# Patient Record
Sex: Male | Born: 1985 | Race: White | Hispanic: No | Marital: Married
Health system: Midwestern US, Community
[De-identification: ages and names within clinical notes are randomized; demographics above are authoritative.]

## PROBLEM LIST (undated history)

## (undated) DIAGNOSIS — A4902 Methicillin resistant Staphylococcus aureus infection, unspecified site: Secondary | ICD-10-CM

## (undated) DIAGNOSIS — K4030 Unilateral inguinal hernia, with obstruction, without gangrene, not specified as recurrent: Secondary | ICD-10-CM

## (undated) DIAGNOSIS — K4090 Unilateral inguinal hernia, without obstruction or gangrene, not specified as recurrent: Secondary | ICD-10-CM

## (undated) HISTORY — PX: KNEE SURGERY: SHX244

## (undated) HISTORY — PX: ORTHOPEDIC SURGERY: SHX850

---

## 2011-01-25 ENCOUNTER — Inpatient Hospital Stay (HOSPITAL_COMMUNITY)
Admission: EM | Admit: 2011-01-25 | Discharge: 2011-01-30 | DRG: 477 | Disposition: A | Discharge status: Home / Self Care | Payer: BC Managed Care – PPO | Attending: Orthopedic Surgery | Admitting: Orthopedic Surgery

## 2011-01-25 DIAGNOSIS — Z8614 Personal history of Methicillin resistant Staphylococcus aureus infection: Secondary | ICD-10-CM

## 2011-01-25 DIAGNOSIS — A4902 Methicillin resistant Staphylococcus aureus infection, unspecified site: Secondary | ICD-10-CM | POA: Diagnosis present

## 2011-01-25 DIAGNOSIS — Z22322 Carrier or suspected carrier of Methicillin resistant Staphylococcus aureus: Secondary | ICD-10-CM

## 2011-01-25 DIAGNOSIS — L02419 Cutaneous abscess of limb, unspecified: Principal | ICD-10-CM | POA: Diagnosis present

## 2011-01-26 DIAGNOSIS — L02419 Cutaneous abscess of limb, unspecified: Secondary | ICD-10-CM

## 2011-01-26 DIAGNOSIS — A4902 Methicillin resistant Staphylococcus aureus infection, unspecified site: Secondary | ICD-10-CM

## 2011-01-26 DIAGNOSIS — L03119 Cellulitis of unspecified part of limb: Secondary | ICD-10-CM

## 2011-01-27 ENCOUNTER — Inpatient Hospital Stay (HOSPITAL_COMMUNITY): Payer: BC Managed Care – PPO

## 2011-01-27 MED ORDER — IOHEXOL 300 MG/ML  SOLN
100.0000 mL | Freq: Once | INTRAMUSCULAR | Status: AC | PRN
  Administered 2011-01-27: 100 mL via INTRAVENOUS

## 2011-01-28 LAB — BASIC METABOLIC PANEL
CO2: 30 mEq/L (ref 19–32)
Glucose, Bld: 92 mg/dL (ref 70–99)
Potassium: 4.4 mEq/L (ref 3.5–5.1)
Sodium: 142 mEq/L (ref 135–145)

## 2011-01-28 MED ORDER — IOHEXOL 300 MG/ML  SOLN
100.0000 mL | Freq: Once | INTRAMUSCULAR | Status: AC | PRN
  Administered 2011-01-28: 100 mL via INTRAVENOUS

## 2011-01-30 LAB — ANAEROBIC CULTURE
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
## 2011-07-01 ENCOUNTER — Emergency Department (HOSPITAL_BASED_OUTPATIENT_CLINIC_OR_DEPARTMENT_OTHER)
Admission: EM | Admit: 2011-07-01 | Discharge: 2011-07-01 | Disposition: A | Discharge status: Home / Self Care | Payer: BC Managed Care – PPO | Attending: Emergency Medicine | Admitting: Emergency Medicine

## 2011-07-01 ENCOUNTER — Encounter: Payer: Self-pay | Admitting: Emergency Medicine

## 2011-07-01 DIAGNOSIS — L0390 Cellulitis, unspecified: Secondary | ICD-10-CM

## 2011-07-01 DIAGNOSIS — F17200 Nicotine dependence, unspecified, uncomplicated: Secondary | ICD-10-CM | POA: Insufficient documentation

## 2011-07-01 DIAGNOSIS — L02419 Cutaneous abscess of limb, unspecified: Secondary | ICD-10-CM | POA: Insufficient documentation

## 2011-07-01 DIAGNOSIS — Z8614 Personal history of Methicillin resistant Staphylococcus aureus infection: Secondary | ICD-10-CM | POA: Insufficient documentation

## 2011-07-01 DIAGNOSIS — L03119 Cellulitis of unspecified part of limb: Secondary | ICD-10-CM | POA: Insufficient documentation

## 2011-07-01 HISTORY — DX: Methicillin resistant Staphylococcus aureus infection, unspecified site: A49.02

## 2011-07-01 MED ORDER — CLINDAMYCIN HCL 150 MG PO CAPS: 300.0000 mg | ORAL_CAPSULE | Freq: Three times a day (TID) | ORAL | Status: AC

## 2011-07-01 MED ORDER — HYDROCODONE-ACETAMINOPHEN 5-325 MG PO TABS: 1.0000 | ORAL_TABLET | ORAL | Status: AC | PRN

## 2011-07-01 NOTE — ED Provider Notes (Signed)
History     Chief Complaint  Patient presents with  . Skin Ulcer   HPI Pt w/ h/o MRSA presents w/ painful abscess w/ surrounding erythema of right anterior thigh x 2 days.  No drainage. No associated fever.  Has not taken anything for pain or applied warm compresses.   Past Medical History  Diagnosis Date  . MRSA (methicillin resistant Staphylococcus aureus)     Past Surgical History  Procedure Date  . Knee surgery   . Orthopedic surgery     No family history on file.  History  Substance Use Topics  . Smoking status: Current Some Day Smoker  . Smokeless tobacco: Not on file  . Alcohol Use: Yes     occ      Review of Systems  All other systems reviewed and are negative.    Physical Exam  BP 139/64  Pulse 83  Temp(Src) 98 F (36.7 C) (Oral)  Resp 16  Ht 6' (1.829 m)  Wt 160 lb (72.576 kg)  BMI 21.70 kg/m2  SpO2 99%  Physical Exam  Nursing note and vitals reviewed. Constitutional: He is oriented to person, place, and time. He appears well-developed and well-nourished. No distress.  HENT:  Head: Normocephalic and atraumatic.  Eyes:       Normal appearance  Neck: Normal range of motion.  Neurological: He is alert and oriented to person, place, and time.  Skin:       1cm of induration at distal right anterior thigh w/ 9x12cm surrounding erythema.  Warm to touch and ttp.    Psychiatric: He has a normal mood and affect. His behavior is normal.    ED Course  Procedures  MDM Pt presents w/ 1cm abscess w/ surrounding cellulitis of right distal anterior thigh.  Did not drain abscess d/t size. Margins drawn w/ skin marker and discharged pt home w/ clinda (sulfa allergy) and pain medication.  Strict return precautions discussed.       Arie Sabina [place], [place] 07/01/11 1737

## 2011-07-01 NOTE — ED Notes (Signed)
Pt c/o sore to RT knee- hx of MRSA

## 2011-07-01 NOTE — ED Notes (Signed)
No PCP 

## 2011-08-11 NOTE — ED Provider Notes (Signed)
History     CSN: [id number] Arrival date & time: 07/01/2011  4:44 PM  Chief Complaint  Patient presents with  . Skin Ulcer   HPI  Past Medical History  Diagnosis Date  . MRSA (methicillin resistant Staphylococcus aureus)     Past Surgical History  Procedure Date  . Knee surgery   . Orthopedic surgery     No family history on file.  History  Substance Use Topics  . Smoking status: Current Some Day Smoker  . Smokeless tobacco: Not on file  . Alcohol Use: Yes     occ      Review of Systems  Physical Exam  BP 139/64  Pulse 83  Temp(Src) 98 F (36.7 C) (Oral)  Resp 16  Ht 6' (1.829 m)  Wt 160 lb (72.576 kg)  BMI 21.70 kg/m2  SpO2 99%  Physical Exam  ED Course  Procedures  MDM Medical screening examination/treatment/procedure(s) were performed by non-physician practitioner and as supervising physician I was immediately available for consultation/collaboration.       Nat Christen, MD 08/11/11 [phone]

## 2011-10-26 IMAGING — CR DG TIBIA/FIBULA 2V*R*
4 series · 4 of 4 positions shown · non-contrast
Comparison: None

CLINICAL DATA: Right knee infection.  Question osteomyelitis.

RIGHT TIBIA AND FIBULA - 2 VIEW

[t tib/fib ap right (1 of 2)]
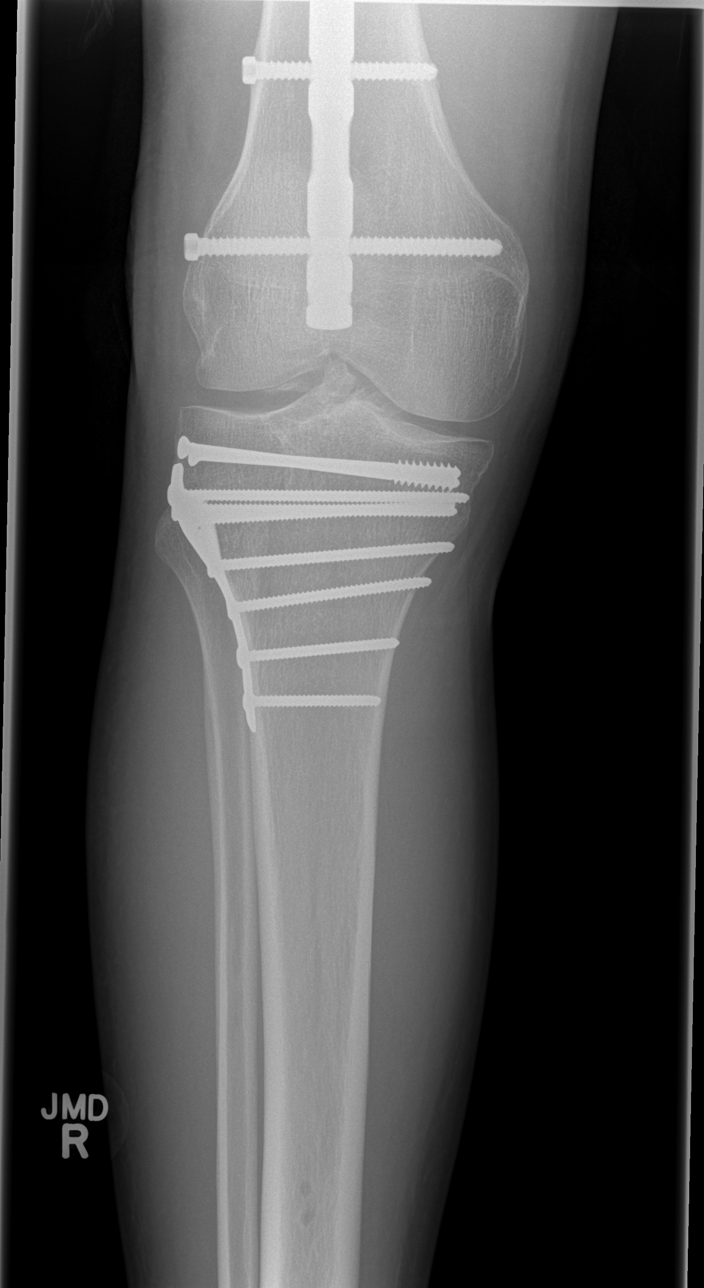

[t tib/fib lat right (1 of 2)]
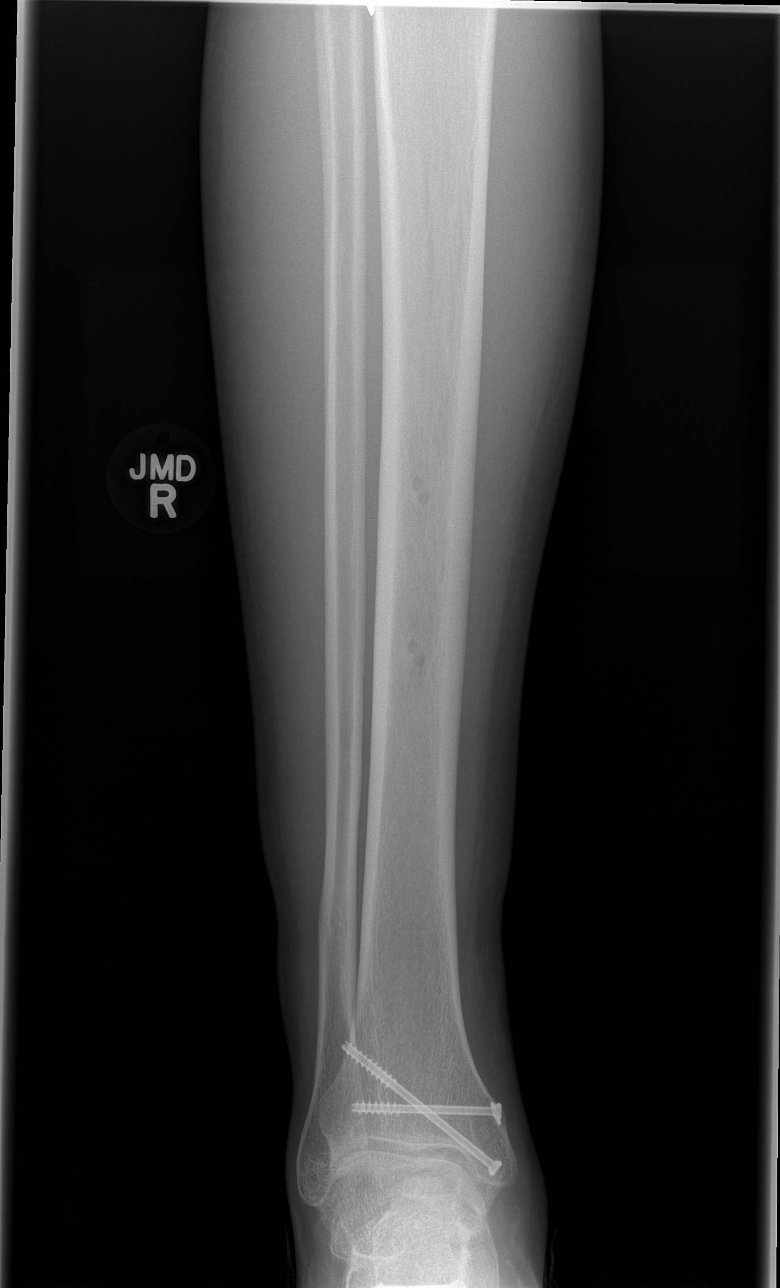

[t tib/fib ap right (2 of 2)]
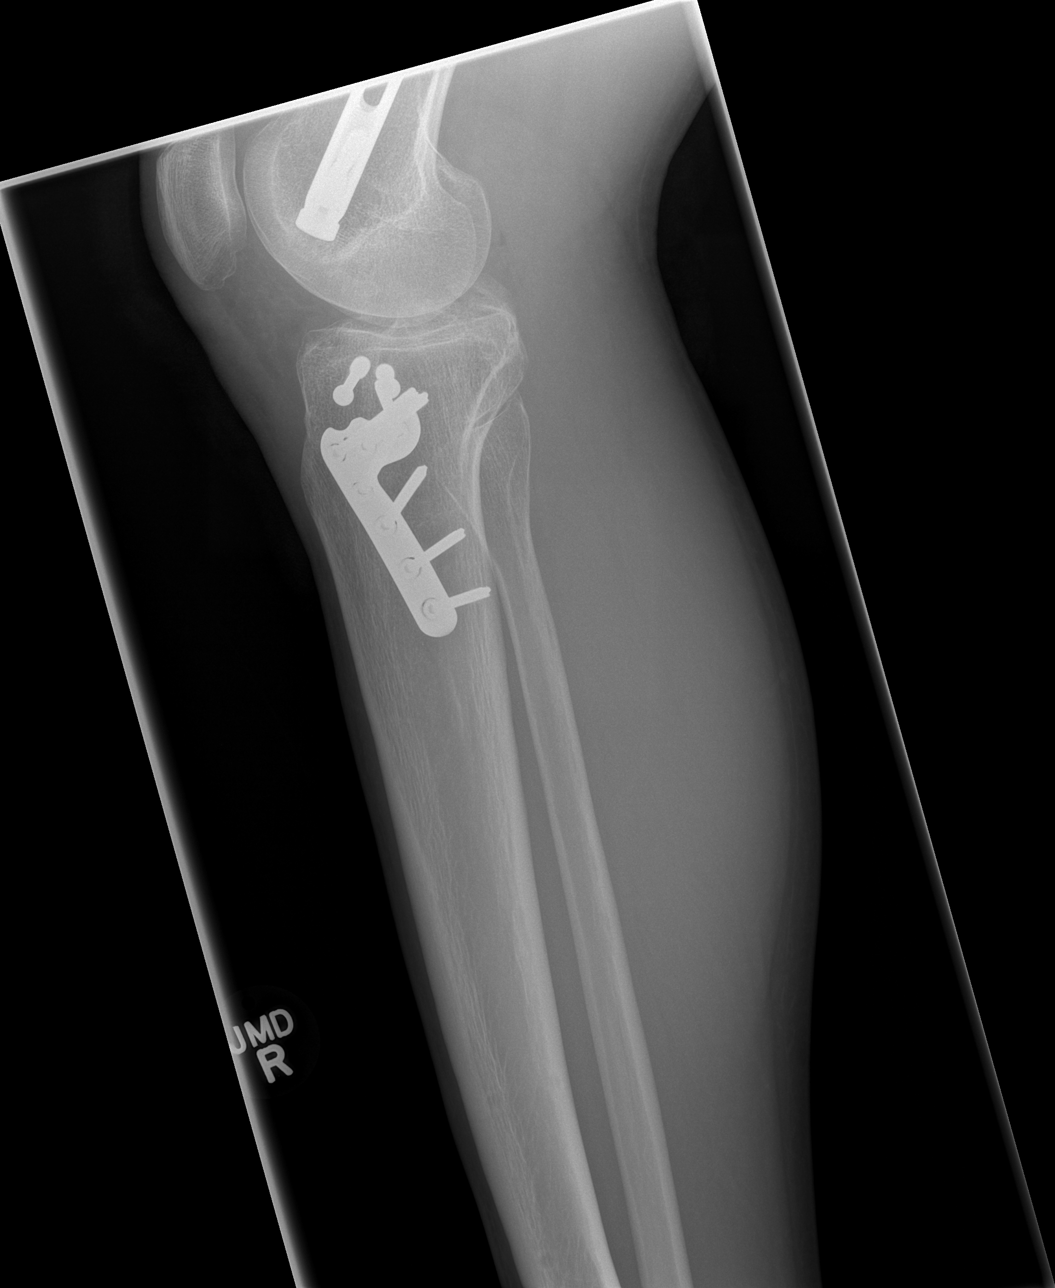

[t tib/fib lat right (2 of 2)]
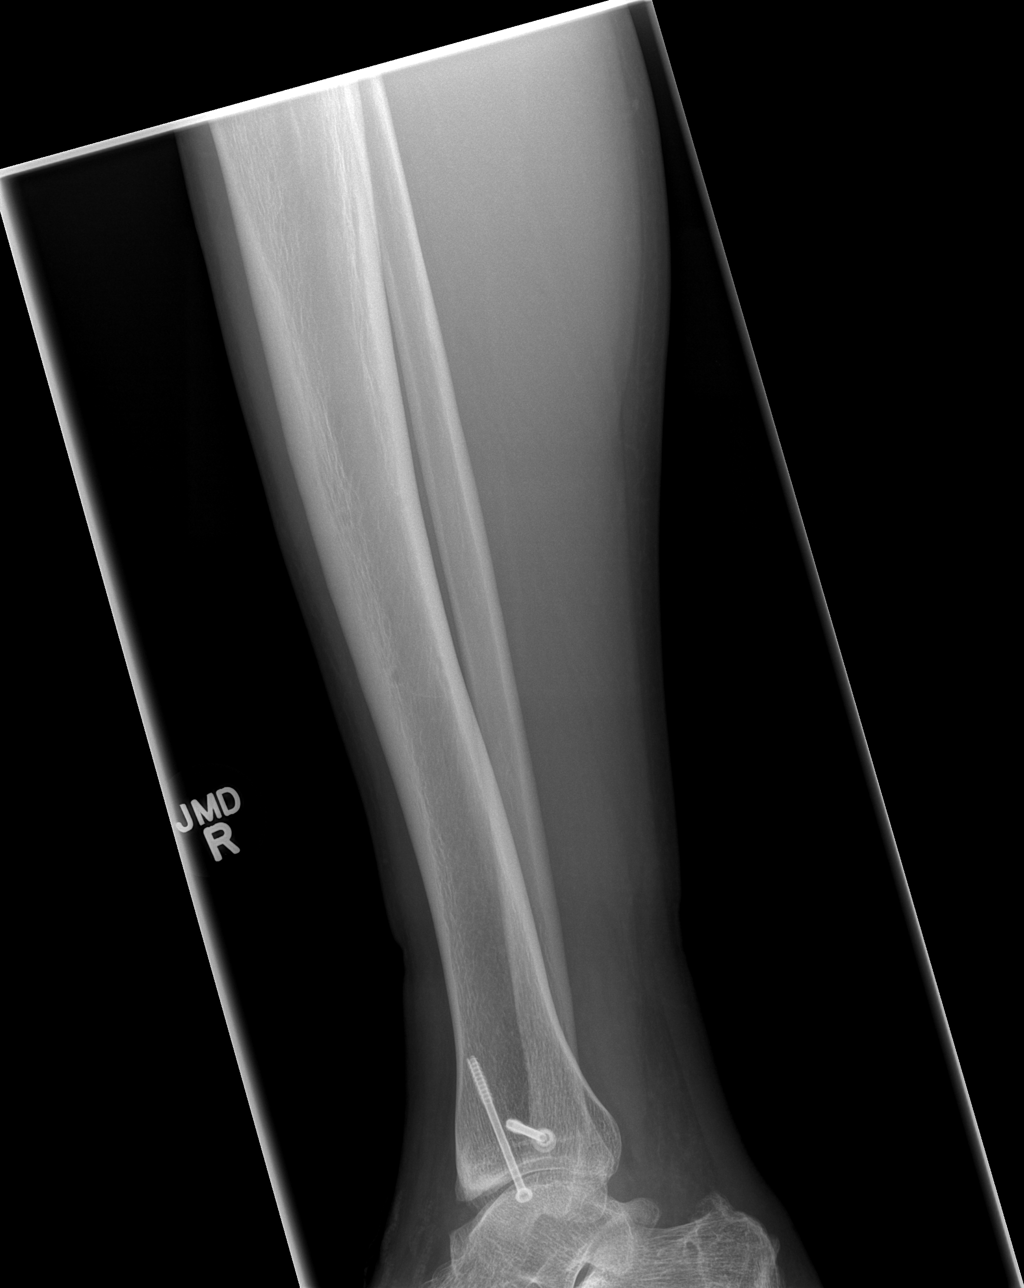

[4 of 4 positions shown; findings below may reference images not displayed]

FINDINGS: The distal aspect of the femoral nail is noted.  Two
distal femoral screws present.  Extensive hardware in the proximal
right tibia as well as in the distal tibia.  Evidence of prior
hardware in the midshaft of the right tibia which has been removed.
I see no bony changes of osteomyelitis.  No acute bony abnormality.
IMPRESSION: No acute findings.

## 2011-10-26 IMAGING — CR DG KNEE COMPLETE 4+V*R*
4 series · 4 of 4 positions shown · non-contrast
Comparison: None

CLINICAL DATA: Right knee infection.  Question osteomyelitis.

RIGHT KNEE - COMPLETE 4+ VIEW

[t knee ap right]
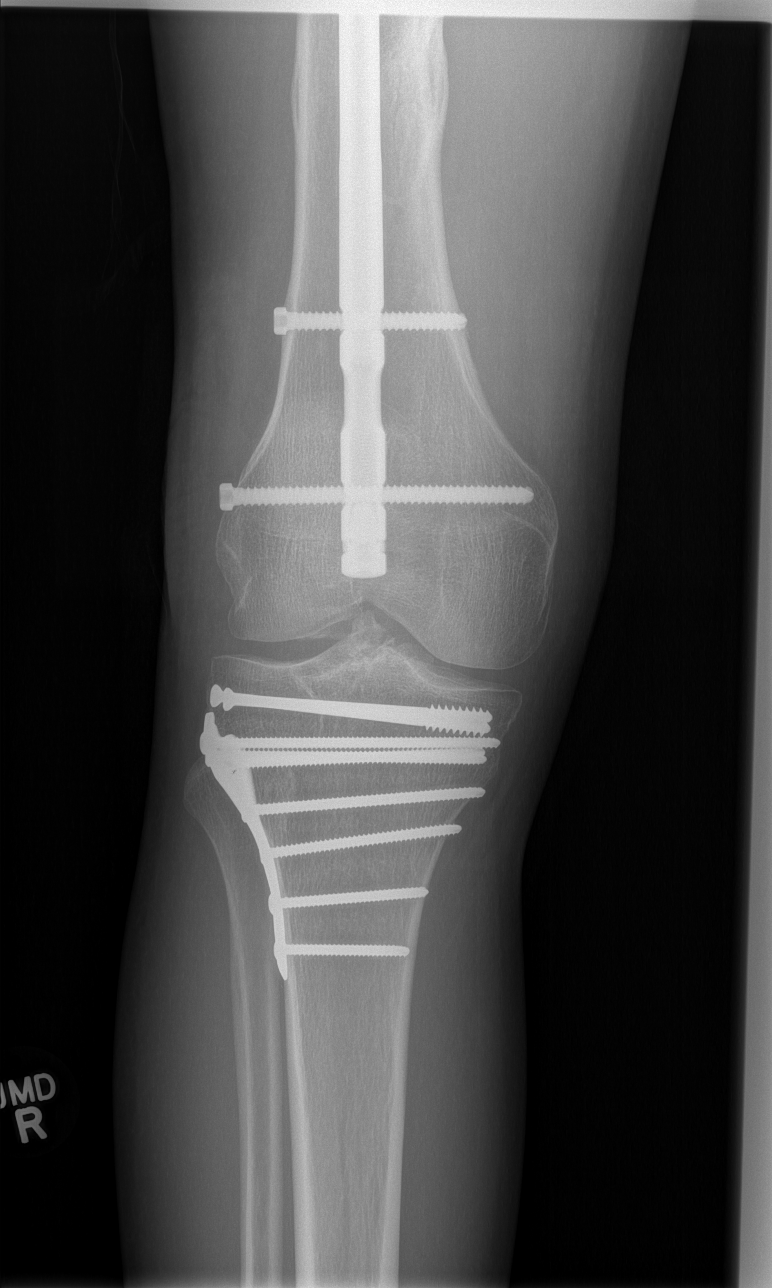

[t knee oblique right (1 of 2)]
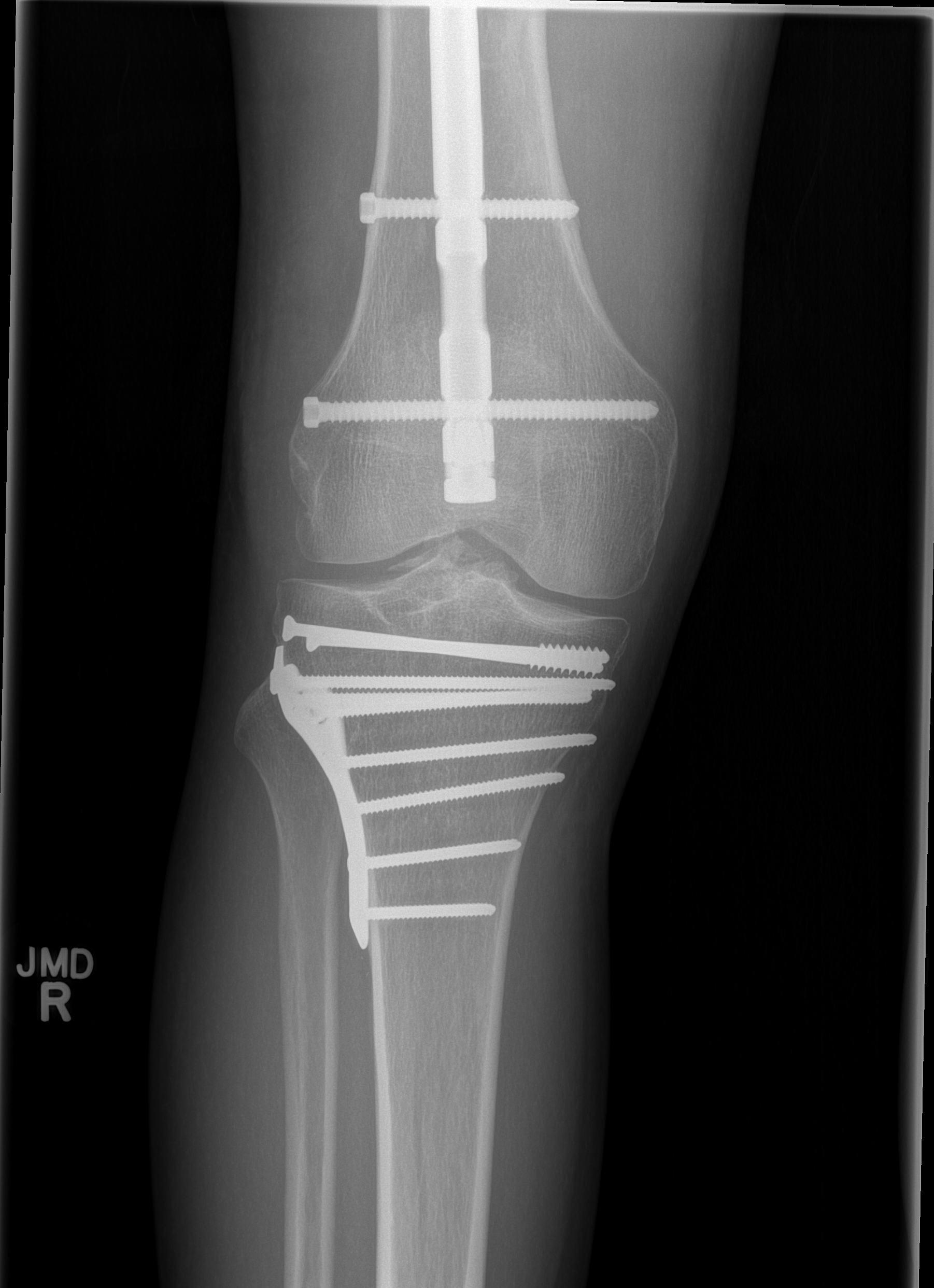

[t knee oblique right (2 of 2)]
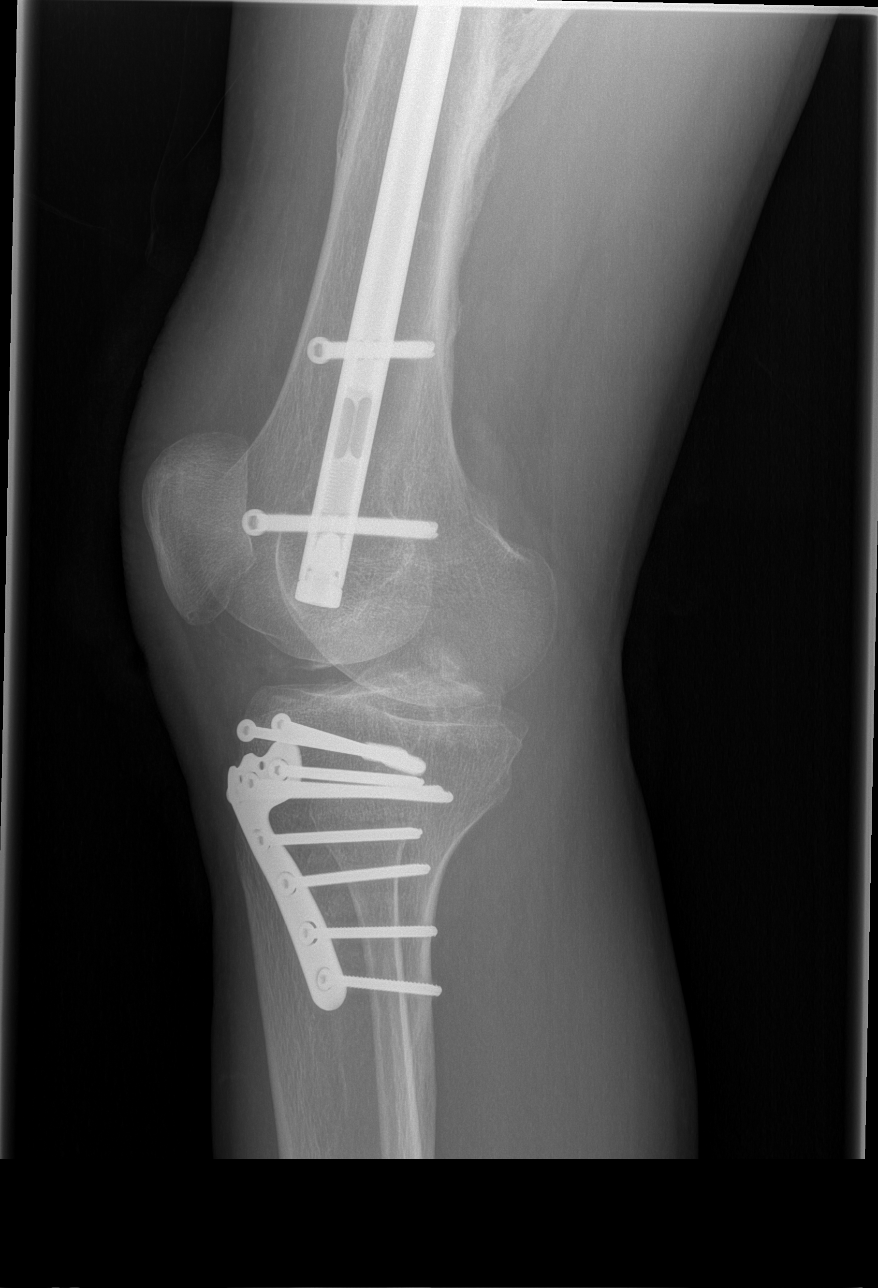

[t knee lat right]
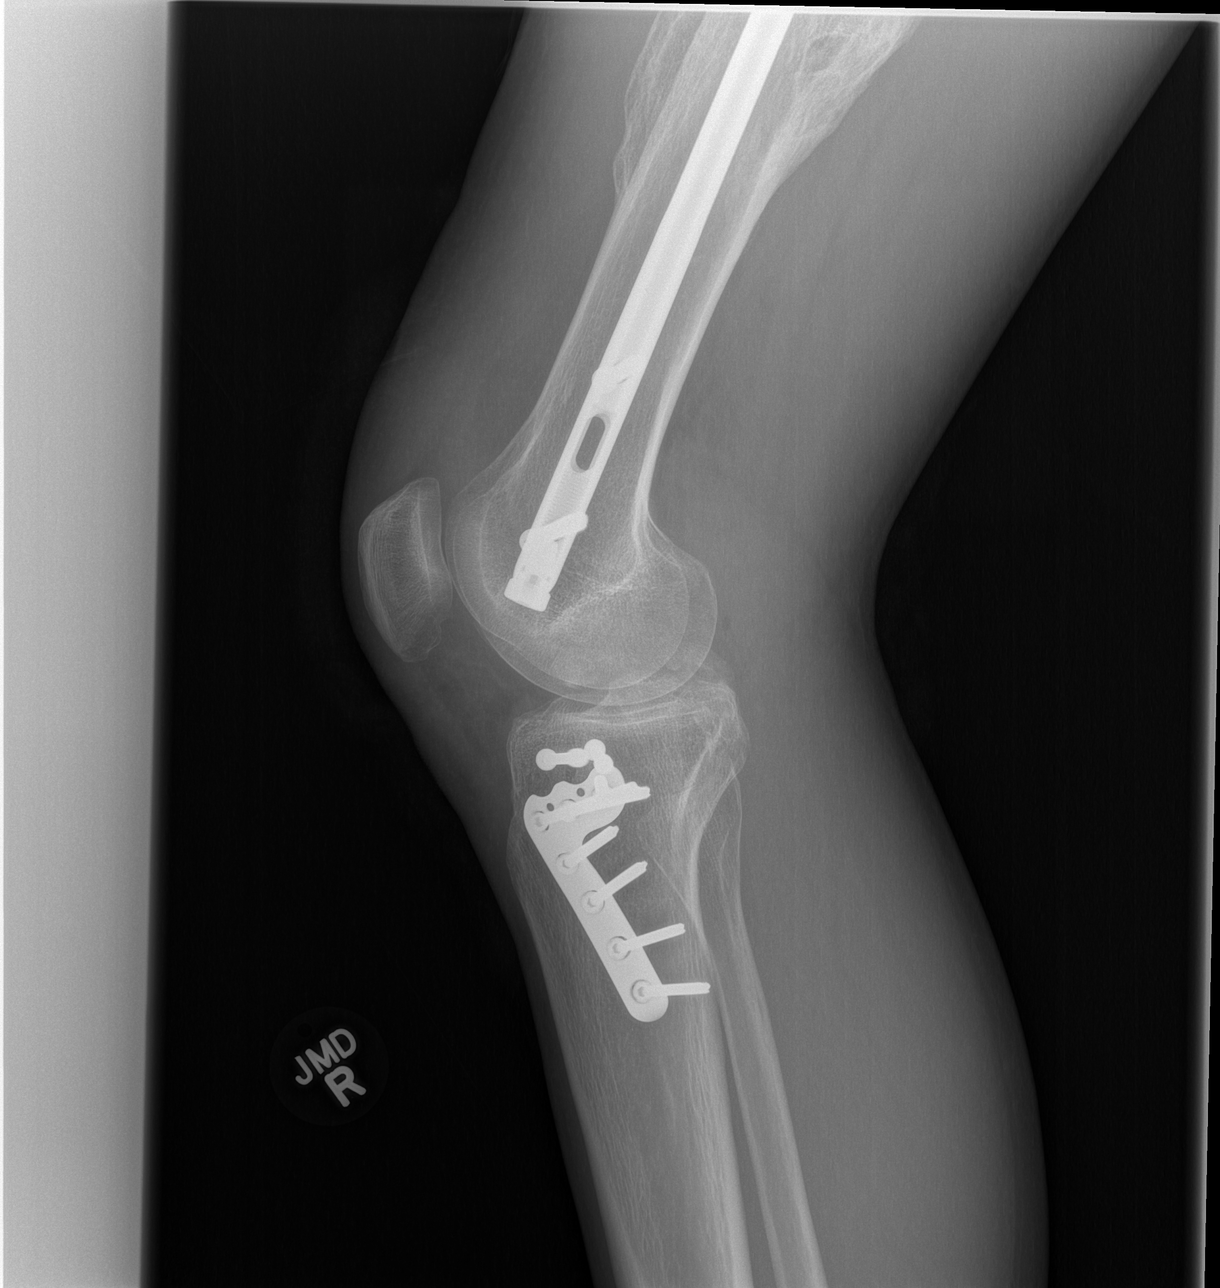

[4 of 4 positions shown; findings below may reference images not displayed]

FINDINGS: Hardware visualized within the distal femur and proximal
tibia.  Post-traumatic deformity of the visualized femoral shaft.
There may be a trace joint effusion in the right knee.  Mild
osteopenia noted.  No acute bony abnormality.  No bony changes of
osteomyelitis.
IMPRESSION: No acute bony abnormality.

## 2019-11-30 ENCOUNTER — Telehealth: Payer: Self-pay

## 2019-11-30 NOTE — Telephone Encounter (Signed)
-----   Message from Colon Branch, MD sent at 11/24/2019 10:38 AM EST ----- Regarding: RE: New pt to est Okay to establish CC: Thomas Flynn ----- Message ----- From: Rosalin Hawking Sent: 11/24/2019  10:32 AM EST To: Colon Branch, MD Subject: New pt to est                                  Thomas Flynn would like to establish as a new pt with Dr Larose Kells if ok with provider. Pt tel [phone].Please advise.

## 2019-11-30 NOTE — Telephone Encounter (Signed)
Okay to schedule

## 2019-12-02 NOTE — Telephone Encounter (Signed)
LM for pt to call and schedule New Pt appt with Dr. Larose Kells

## 2020-03-15 NOTE — ED Provider Notes (Signed)
Ortho Complaint *ED        Patient:   Keith Welch, Keith Welch             MRN: [id number]            FIN: [id number]               Age:   34 years     Sex:  Male     DOB:  [birth year]   Associated Diagnoses:   Knee effusion, right   Author:   Cynda Acres N-PA      Basic Information   Time seen: Provider Seen (ST)   ED Provider/Time:    Magie Ciampa,  Ariela Mochizuki N-PA / 03/15/2020 12:33  .   Additional information: Chief Complaint from Nursing Triage Note   Chief Complaint  Chief Complaint: R knee pain, fell on Saturday, hx of surg on R knee, pain and swelling present. Dr. Leafy Kindle ortho, pt fell on R knee (03/15/20 12:26:00).      History of Present Illness   Patient is a 34 year old male who presents the emergency department with complaints of right knee pain.  Patient has had several surgeries on this knee all in [facility], has not seen orthopedist since he moved to [place] years ago.  He reports on Saturday he fell, landing right on his right knee, pain worsened since then.  He has not been taking any medications for his symptoms.  He states that the area feels swollen and unstable.  He denies any other injury sustained at the time.  He has no other complaints..        Review of Systems   Constitutional symptoms:  No fever,    Skin symptoms:  No rash,    Respiratory symptoms:  No cough,    Cardiovascular symptoms:  No chest pain,    Gastrointestinal symptoms:  No abdominal pain,    Musculoskeletal symptoms:  Joint pain, No back pain,    Neurologic symptoms:  No headache,    Psychiatric symptoms:  Negative except as documented in HPI.             Additional review of systems information: All other systems reviewed and otherwise negative.      Health Status   Allergies:    Allergic Reactions (Selected)  Mild  Amoxicillin- Rash..   Medications:  (Selected)   .      Past Medical/ Family/ Social History   Medical history:    No active or resolved past medical history items have been selected or recorded., Reviewed as  documented in chart.   Surgical history:    No active procedure history items have been selected or recorded., Reviewed as documented in chart.   Family history: Reviewed as documented in chart.   Social history: Reviewed as documented in chart.   Problem list:    No qualifying data available  , per nurse's notes.      Physical Examination               Vital Signs   Vital Signs   05/15/4402 47:42 EDT Systolic Blood Pressure 595 mmHg    Diastolic Blood Pressure 88 mmHg    Temperature Oral 36.9 degC    Heart Rate Monitored 103 bpm  HI    Respiratory Rate 20 br/min    SpO2 94 %   .   Measurements   03/15/2020 12:30 EDT Body Mass Index est meas 23.11 kg/m2    Body Mass Index Measured 23.[place]  kg/m2   03/15/2020 12:26 EDT Height/Length Measured 182.9 cm    Weight Dosing 77.3 kg   .   General:  Alert, no acute distress.    Skin:  Warm.   Head:  Normocephalic, atraumatic.    Neck:  Supple.   Eye:  Pupils are equal, round and reactive to light.   Cardiovascular:  Regular rate and rhythm, Normal peripheral perfusion.    Respiratory:  Respirations are non-labored, Symmetrical chest wall expansion.    Gastrointestinal:  Soft.   Back:  Nontender.   Musculoskeletal:  Several well-healed surgical scars, joint effusion, mild diffuse tenderness on palpation, no obvious ligamentous laxity, decreased range of motion secondary to pain, distal neurovascular exam is intact.   Neurological:  Alert and oriented to person, place, time, and situation, No focal neurological deficit observed.    Psychiatric:  Cooperative, appropriate mood & affect.       Medical Decision Making   Differential Diagnosis:  Contusion, sprain, strain, fracture.    Documents reviewed:  Emergency department nurses' notes.   Radiology results:  Rad Results (ST)   XR Knee 3 Views Right  ?  03/15/20 13:26:41  History: RIGHT knee pain. Acute onset. Initial encounter Pain, Joint Knee    Comparison: None    Findings:  3 views right knee show no fracture or dislocation. Patient  is undergone  intramedullary rod with distal interlocking screw fixation of a partly imaged  distal femoral fracture. I see no hardware complication. In addition, there is  lateral plate and screw and interfragmentary screw fixation of a tibial plateau  fracture. There is no hardware complication. Fractures healed. I see no acute  osseous abnormality Bone mineralization is normal. Tricompartment left knee  joint space narrowing is present. Joint effusion is noted.. Soft tissues are  intact. I see no appreciable effusion    Impression:  Tricompartment right knee osteoarthritis. Joint effusion.    ORIF distal femoral and tibial plateau fractures without hardware complication  ?  Signed By: Kristine Garbe A-MD  .   Notes:  Patient in the emergency department for evaluation of right knee pain, x-ray shows no acute bony abnormality, discussed this with the patient, recommended knee immobilizer and crutches, Ortho follow-up, NSAIDs and pain medication, ice, rest, return to the emergency department for any worsening symptoms, problems or concerns. Patient is in agreement with this plan of care. They express understanding and have no further questions at this time. Patient is afebrile, nontoxic appearing and in no acute distress. Discharged to home in stable condition..      Impression and Plan   Diagnosis   Knee effusion, right (ICD10-CM M25.461, Discharge, Medical)   Plan   Condition: Improved.    Disposition: Discharged: Time  03/15/2020 13:37:00, to home.    Prescriptions: [facility] prescriptions   Pharmacy:  Norco 325 mg-5 mg oral tablet (Prescribe): 1 tabs, Oral, q6hr, for 3 days, PRN: moderate pain (4-7), 12 tabs, 0 Refill(s)  ibuprofen 800 mg oral tablet (Prescribe): 800 mg, 1 tabs, Oral, TID, for 5 days, PRN: moderate pain (4-7), 15 tabs, 0 Refill(s).    Patient was given the following educational materials: Knee Effusion, Easy-to-Read, Knee Pain, Easy-to-Read, Knee Sprain, Easy-to-Read, Cryotherapy, Easy-to-Read,  Excuse from Work, School, or Physical Activity w restrictions (CUSTOM).    Follow up with: Overton Mam, Physician - Orthopedics Within 1 week Take medication as prescribed  Follow up with your primary care doctor  Return to the emergency department for any new, worsening or concerning  symptoms  .    Counseled: I had a detailed discussion with the patient and/or guardian regarding the historical points/exam findings supporting the discharge diagnosis and need for outpatient followup. Discussed the need to return to the ER if symptoms persist/worsen, or for any questions/concerns that arise at home.    Signature Line     Electronically Signed on 03/15/2020 02:09 PM EDT   ________________________________________________   Ruthine Dose N-PA      Electronically Signed on 03/15/2020 02:52 PM EDT   ________________________________________________   Jackquline Bosch,  ADAM HOWARD-DO            Modified by: Ruthine Dose N-PA on 03/15/2020 02:09 PM EDT

## 2020-03-15 NOTE — ED Notes (Signed)
ED Triage Note       ED Secondary Triage Entered On:  03/15/2020 12:32 EDT    Performed On:  03/15/2020 12:31 EDT by Chestine Spore, RN, Georgiann Mohs               General Information   Barriers to Learning :   None evident   ED Home Meds Section :   Document assessment   [facility] ED Fall Risk Section :   Document assessment   ED Advance Directives Section :   Document assessment   ED Palliative Screen :   N/A (prefilled for <65yo)   Chestine Spore RN, Georgiann Mohs - 03/15/2020 12:31 EDT   (As Of: 03/15/2020 12:32:17 EDT)   Diagnoses(Active)    Knee pain-swelling, R  Date:   03/15/2020 ; Diagnosis Type:   Reason For Visit ; Confirmation:   Complaint of ; Clinical Dx:   Knee pain-swelling, R ; Classification:   Medical ; Clinical Service:   Non-Specified ; Code:   PNED ; Probability:   0 ; Diagnosis Code:   [id number]             -    Procedure History   (As Of: 03/15/2020 12:32:17 EDT)     [facility]ch Fall Risk Assessment Tool   Hx of falling last 3 months ED Fall :   Yes (Single mechanical fall)   Patient confused or disoriented ED Fall :   No   Patient intoxicated or sedated ED Fall :   No   Patient impaired gait ED Fall :   No   Use a mobility assistance device ED Fall :   No   Patient altered elimination ED Fall :   No   [facility] ED Fall Score :   1    Chestine Spore RNGeorgiann Mohs - 03/15/2020 12:31 EDT   ED Advance Directive   Advance Directive :   No   Chestine Spore RN, Georgiann Mohs - 03/15/2020 12:31 EDT

## 2020-03-15 NOTE — ED Notes (Signed)
 ED Patient Education Note     Patient Education Materials Follows:  Musculoskeletal     Knee Sprain    A knee sprain is a tear in the strong bands of tissue that connect the bones (ligaments) of your knee.      HOME CARE     Raise (elevate) your injured knee to lessen puffiness (swelling).     To ease pain and puffiness, put ice on the injured area.    ? Put ice in a plastic bag.    ? Place a towel between your skin and the bag.    ? Leave the ice on for 20 minutes, 2?3 times a day.     Only take medicine as told by your doctor.      Do not leave your knee unprotected until pain and stiffness go away (usually 4?6 weeks).     If you have a cast or splint, do not get it wet. If your doctor told you to not take it off, cover it with a plastic bag when you shower or bathe. Do not swim.     Your doctor may have you do exercises to prevent or limit permanent weakness and stiffness.    GET HELP RIGHT AWAY IF:     Your cast or splint becomes damaged.     Your pain gets worse.     You have a lot of pain, puffiness, or numbness below the cast or splint.    MAKE SURE YOU:     Understand these instructions.      Will watch your condition.     Will get help right away if you are not doing well or get worse.    This information is not intended to replace advice given to you by your health care provider. Make sure you discuss any questions you have with your health care provider.    Document Released: 11/14/2009 Document Revised: 12/01/2013 Document Reviewed: 08/04/2013  Elsevier Interactive Patient Education ?2016 Elsevier Inc.         Knee Pain    Knee pain is a common problem. It can have many causes. The pain often goes away by following your doctor's home care instructions. Treatment for ongoing pain will depend on the cause of your pain. If your knee pain continues, more tests may be needed to diagnose your condition. Tests may include X-rays or other imaging studies of your knee.      HOME CARE     Take medicines only as  told by your doctor.     Rest your knee and keep it raised (elevated) while you are resting.     Do not do things that cause pain or make your pain worse.     Avoid activities where both feet leave the ground at the same time, such as running, jumping rope, or doing jumping jacks.     Apply ice to the knee area:    ? Put ice in a plastic bag.    ? Place a towel between your skin and the bag.    ? Leave the ice on for 20 minutes, 2?3 times a day.      Ask your doctor if you should wear an elastic knee support.     Sleep with a pillow under your knee.     Lose weight if you are overweight. Being overweight can make your knee hurt more.     Do not use any tobacco products, including cigarettes, chewing tobacco,  or electronic cigarettes. If you need help quitting, ask your doctor. Smoking may slow the healing of any bone and joint problems that you may have.    GET HELP IF:     Your knee pain does not stop, it changes, or it gets worse.     You have a fever along with knee pain.     Your knee gives out or locks up.      Your knee becomes more swollen.    GET HELP RIGHT AWAY IF:     Your knee feels hot to the touch.     You have chest pain or trouble breathing.    This information is not intended to replace advice given to you by your health care provider. Make sure you discuss any questions you have with your health care provider.    Document Released: 02/22/2009 Document Revised: 12/17/2014 Document Reviewed: 01/27/2014  Elsevier Interactive Patient Education ?2016 Elsevier Inc.         Knee Effusion    Knee effusion means that you have extra fluid in your knee. This can cause pain. Your knee may be more difficult to bend and move.      HOME CARE     Use crutches as told by your doctor.     Wear a knee brace as told by your doctor.     Apply ice to the swollen area:    ? Put ice in a plastic bag.    ? Place a towel between your skin and the bag.    ? Leave the ice on for 20 minutes, 2?3 times per day.     Keep your  knee raised (elevated) when you are sitting or lying down.     Take medicines only as told by your doctor.     Do any rehabilitation or strengthening exercises as told by your doctor.     Rest your knee as told by your doctor. You may start doing your normal activities again when your doctor says it is okay.     Keep all follow-up visits as told by your doctor. This is important.    GET HELP IF:     You continue to have pain in your knee.    GET HELP RIGHT AWAY IF:     You have increased swelling or redness of your knee.     You have severe pain in your knee.     You have a fever.    This information is not intended to replace advice given to you by your health care provider. Make sure you discuss any questions you have with your health care provider.    Document Released: 12/29/2010 Document Revised: 12/17/2014 Document Reviewed: 07/12/2014  Elsevier Interactive Patient Education ?2016 Elsevier Inc.      Procedures     Cryotherapy    Cryotherapy is when you put ice on your injury. Ice helps lessen pain and puffiness (swelling) after an injury. Ice works the best when you start using it in the first 24 to 48 hours after an injury.      HOME CARE     Put a dry or damp towel between the ice pack and your skin.     You may press gently on the ice pack.      Leave the ice on for no more than 10 to 20 minutes at a time.     Check your skin after 5 minutes to make sure your skin  is okay.      Rest at least 20 minutes between ice pack uses.     Stop using ice when your skin loses feeling (numbness).     Do not use ice on someone who cannot tell you when it hurts. This includes small children and people with memory problems (dementia).     GET HELP RIGHT AWAY IF:     You have white spots on your skin.     Your skin turns blue or pale.     Your skin feels waxy or hard.     Your puffiness gets worse.    MAKE SURE YOU:     Understand these instructions.      Will watch your condition.     Will get help right away if you are  not doing well or get worse.    This information is not intended to replace advice given to you by your health care provider. Make sure you discuss any questions you have with your health care provider.    Document Released: 05/14/2008 Document Revised: 02/18/2012 Document Reviewed: 08/10/2015  Elsevier Interactive Patient Education ?2016 Elsevier Inc.                         [facility] Emergency Department  Excuse from Work, School, or Physical Activity  __________________________________________________was treated at our facility.  PATIENT MAY RETURN TO WORK  . Employee may return to work on ____________________   Human resources officer may return to modified work on ____________________  EXCUSE PATIENT FOR THE FOLLOWING DATES: ___________________  WORK ACTIVITY RESTRICTIONS  Work activities that are not tolerated include:  _____Bending  _____Prolonged sitting  _____Lifting more than ____________________ lb  _____Squatting  _____Prolonged standing  _____Climbing  _____Reaching  _____Pushing and pulling  _____Walking  _____Other ____________________  These restrictions are effective until ____________________.  Show this Return to Work statement to your supervisor at work as soon as possible. Your employer should be aware of your condition and can help with the necessary work activity restrictions. If you wish to return to work sooner than the date that is listed above, or if you have further problems that make it difficult for you to return at that time, please call our clinic or your health care provider.  _________________________________________  Health Care Provider (signature)   ________________________________________________________________________

## 2020-03-15 NOTE — ED Notes (Signed)
ED Patient Summary       ;       [facility] Emergency Department  [address], [place] [address]  [phone]  Discharge Instructions (Patient)  _______________________________________     Name: Keith Welch, Keith Welch  DOB: 1986/11/28                   MRN: [id number]                   FIN: [id number]  Reason For Visit: Knee pain-swelling, R; RIGHT KNEE PAIN  Final Diagnosis: Knee effusion, right     Visit Date: 03/15/2020 12:23:00  Address: [address] [place] [address]  Phone: [phone]     Emergency Department Providers:         Primary Physician:            [facility] would like to thank you for allowing [place] to assist you with your healthcare needs. The following includes patient education materials and information regarding your injury/illness.     Follow-up Instructions:  You were seen today on an emergency basis. Please contact your primary care doctor for a follow up appointment. If you received a referral to a specialist doctor, it is important you follow-up as instructed.    It is important that you call your follow-up doctor to schedule and confirm the location of your next appointment. Your doctor may practice at multiple locations. The office location of your follow-up appointment may be different to the one written on your discharge instructions.    If you do not have a primary care doctor, please call [phone] for help in finding a [facility] Provider. For help in finding a specialist doctor, please call [phone].    The [facility] "Ask a Nurse" line in staffed by Registered Nurses and is a free service to the community. We are available Monday - Friday from 8am to 5pm to answer your questions about your health. Please call [phone].    If your condition gets worse before your follow-up with your primary care doctor or specialist, please return to the Emergency Department.        Coronavirus 2019 (COVID-19)  Reminders:     Patients aged 46 and older, people with increased risk for severe COVID-19 disease, or frontline workers with increased occupational risk can make an appointment for a COVID-19 vaccine. Patients can contact their [facility] doctors' offices to schedule an appointment to receive the COVID-19 vaccine at the [facility] or send [place] an email at [email or web address]. Patients who do not have a [facility] physician can call [phone] to schedule vaccination appointments.            Scan this code with your phone camera to send an email to the address above.          Follow Up Appointments:  Primary Care Provider:      Name: PCP,  NONE      Phone:                  With: Address: When:   Overton Mam, Physician - Orthopedics [address], [address] [place], [place] [address]  [phone] Business (1) Within 1 week   Comments:   Take medication as prescribed  Follow up with your primary care doctor  Return to the emergency department for any new, worsening or concerning  symptoms                Printed Prescriptions:    Patient Education Materials:  Discharge Orders          Discharge Patient 03/15/20 13:38:00 EDT         Comment:      Excuse from Work, [employer], or Physical Activity w restrictions (CUSTOM); Cryotherapy, Easy-to-Read; Knee Sprain, Easy-to-Read; Knee Pain, Easy-to-Read; Knee Effusion, Easy-to-Read                   [facility] Emergency Department  Excuse from Work, School, or Physical Activity  __________________________________________________was treated at our facility.  PATIENT MAY RETURN TO WORK  . Employee may return to work on ____________________   Human resources officer may return to modified work on ____________________  EXCUSE PATIENT FOR THE FOLLOWING DATES: ___________________  WORK ACTIVITY RESTRICTIONS  Work activities that are not tolerated include:  _____Bending  _____Prolonged sitting  _____Lifting more than  ____________________ lb  _____Squatting  _____Prolonged standing  _____Climbing  _____Reaching  _____Pushing and pulling  _____Walking  _____Other ____________________  These restrictions are effective until ____________________.  Show this Return to Work statement to your supervisor at work as soon as possible. Your employer should be aware of your condition and can help with the necessary work activity restrictions. If you wish to return to work sooner than the date that is listed above, or if you have further problems that make it difficult for you to return at that time, please call our clinic or your health care provider.  _________________________________________  Health Care Provider (signature)   ________________________________________________________________________       Cryotherapy    Cryotherapy is when you put ice on your injury. Ice helps lessen pain and puffiness (swelling) after an injury. Ice works the best when you start using it in the first 24 to 48 hours after an injury.      HOME CARE     Put a dry or damp towel between the ice pack and your skin.     You may press gently on the ice pack.      Leave the ice on for no more than 10 to 20 minutes at a time.     Check your skin after 5 minutes to make sure your skin is okay.      Rest at least 20 minutes between ice pack uses.     Stop using ice when your skin loses feeling (numbness).     Do not use ice on someone who cannot tell you when it hurts. This includes small children and people with memory problems (dementia).     GET HELP RIGHT AWAY IF:     You have white spots on your skin.     Your skin turns blue or pale.     Your skin feels waxy or hard.     Your puffiness gets worse.    MAKE SURE YOU:     Understand these instructions.      Will watch your condition.     Will get help right away if you are not doing well or get worse.    This information is not intended to replace advice given to you by your health care provider. Make sure you  discuss any questions you have with your health care provider.    Document Released: 05/14/2008 Document Revised: 02/18/2012 Document Reviewed: 08/10/2015  Elsevier Interactive Patient Education ?2016 Elsevier Inc.  Knee Sprain    A knee sprain is a tear in the strong bands of tissue that connect the bones (ligaments) of your knee.      HOME CARE     Raise (elevate) your injured knee to lessen puffiness (swelling).     To ease pain and puffiness, put ice on the injured area.    ? Put ice in a plastic bag.    ? Place a towel between your skin and the bag.    ? Leave the ice on for 20 minutes, 2?3 times a day.     Only take medicine as told by your doctor.      Do not leave your knee unprotected until pain and stiffness go away (usually 4?6 weeks).     If you have a cast or splint, do not get it wet. If your doctor told you to not take it off, cover it with a plastic bag when you shower or bathe. Do not swim.     Your doctor may have you do exercises to prevent or limit permanent weakness and stiffness.    GET HELP RIGHT AWAY IF:     Your cast or splint becomes damaged.     Your pain gets worse.     You have a lot of pain, puffiness, or numbness below the cast or splint.    MAKE SURE YOU:     Understand these instructions.      Will watch your condition.     Will get help right away if you are not doing well or get worse.    This information is not intended to replace advice given to you by your health care provider. Make sure you discuss any questions you have with your health care provider.    Document Released: 11/14/2009 Document Revised: 12/01/2013 Document Reviewed: 08/04/2013  Elsevier Interactive Patient Education ?2016 Elsevier Inc.       Knee Pain    Knee pain is a common problem. It can have many causes. The pain often goes away by following your doctor's home care instructions. Treatment for ongoing pain will depend on the cause of your pain. If your knee pain continues, more tests may be needed to  diagnose your condition. Tests may include X-rays or other imaging studies of your knee.      HOME CARE     Take medicines only as told by your doctor.     Rest your knee and keep it raised (elevated) while you are resting.     Do not do things that cause pain or make your pain worse.     Avoid activities where both feet leave the ground at the same time, such as running, jumping rope, or doing jumping jacks.     Apply ice to the knee area:    ? Put ice in a plastic bag.    ? Place a towel between your skin and the bag.    ? Leave the ice on for 20 minutes, 2?3 times a day.      Ask your doctor if you should wear an elastic knee support.     Sleep with a pillow under your knee.     Lose weight if you are overweight. Being overweight can make your knee hurt more.     Do not use any tobacco products, including cigarettes, chewing tobacco, or electronic cigarettes. If you need help quitting, ask your doctor. Smoking may slow the healing of any bone and  joint problems that you may have.    GET HELP IF:     Your knee pain does not stop, it changes, or it gets worse.     You have a fever along with knee pain.     Your knee gives out or locks up.      Your knee becomes more swollen.    GET HELP RIGHT AWAY IF:     Your knee feels hot to the touch.     You have chest pain or trouble breathing.    This information is not intended to replace advice given to you by your health care provider. Make sure you discuss any questions you have with your health care provider.    Document Released: 02/22/2009 Document Revised: 12/17/2014 Document Reviewed: 01/27/2014  Elsevier Interactive Patient Education ?2016 Elsevier Inc.       Knee Effusion    Knee effusion means that you have extra fluid in your knee. This can cause pain. Your knee may be more difficult to bend and move.      HOME CARE     Use crutches as told by your doctor.     Wear a knee brace as told by your doctor.     Apply ice to the swollen area:    ? Put ice in a plastic  bag.    ? Place a towel between your skin and the bag.    ? Leave the ice on for 20 minutes, 2?3 times per day.     Keep your knee raised (elevated) when you are sitting or lying down.     Take medicines only as told by your doctor.     Do any rehabilitation or strengthening exercises as told by your doctor.     Rest your knee as told by your doctor. You may start doing your normal activities again when your doctor says it is okay.     Keep all follow-up visits as told by your doctor. This is important.    GET HELP IF:     You continue to have pain in your knee.    GET HELP RIGHT AWAY IF:     You have increased swelling or redness of your knee.     You have severe pain in your knee.     You have a fever.    This information is not intended to replace advice given to you by your health care provider. Make sure you discuss any questions you have with your health care provider.    Document Released: 12/29/2010 Document Revised: 12/17/2014 Document Reviewed: 07/12/2014  Elsevier Interactive Patient Education ?2016 Elsevier Inc.         Allergy Info: amoxicillin     Medication Information:  [facility] ED Physicians provided you with a complete list of medications post discharge, if you have been instructed to stop taking a medication please ensure you also follow up with this information to your Primary Care Physician.  Unless otherwise noted, patient will continue to take medications as prescribed prior to the Emergency Room visit.  Any specific questions regarding your chronic medications and dosages should be discussed with your physician(s) and pharmacist.          HYDROcodone-acetaminophen (Norco 325 mg-5 mg oral tablet) 1 Tabs Oral (given by mouth) every 6 hours as needed moderate pain (4-7) for 3 Days. Refills: 0.  ibuprofen (ibuprofen 800 mg oral tablet) 1 Tabs Oral (given by mouth) 3 times a day  as needed moderate pain (4-7) for 5 Days. Refills: 0.      Medications Administered During Visit:        Major Tests and Procedures:  The following procedures and tests were performed during your ED visit.  COMMON PROCEDURES%>  COMMON PROCEDURES COMMENTS%>          Laboratory Orders  No laboratory orders were placed.              Radiology Orders  Name Status Details   XR Knee 3 Views Right Completed 03/15/20 12:48:00 EDT, STAT 1 hour or less, Reason: Pain, Joint Knee, 3rd View Oblique-Not Sunrise, Transport Mode: STRETCHER, [id number]               Patient Care Orders  Name Status Details   Crutches Completed 03/15/20 13:47:00 EDT, Once, 03/15/20 13:47:00 EDT, With Patient teaching   Discharge Patient Ordered 03/15/20 13:38:00 EDT   ED Assessment Adult Completed 03/15/20 12:30:16 EDT, 03/15/20 12:30:16 EDT   ED Secondary Triage Completed 03/15/20 12:30:16 EDT, 03/15/20 12:30:16 EDT   ED Triage Adult Completed 03/15/20 12:23:24 EDT, 03/15/20 12:23:24 EDT   Patient Specific Fall Safety Measures Completed 03/15/20 12:32:17 EDT, Once, 03/15/20 12:32:17 EDT, ED Low Fall Risk Documentation   Splint Application Completed 03/15/20 13:47:00 EDT, Once, 03/15/20 13:47:00 EDT, Knee Immobilizer to Affected Knee       ---------------------------------------------------------------------------------------------------------------------  [facility]) encourages you to self-enroll in the [facility] Patient Portal.  [facility] Patient Portal will allow you to manage your personal health information securely from your own electronic device now and in the future.  To begin your Patient Portal enrollment process, please visit [email or web address] Click on "Sign up now" under [facility].  If you find that you need additional assistance on the [facility] Patient Portal or need a copy of your medical records, please call the [facility] Medical Records Office at [phone].  Comment:

## 2020-03-15 NOTE — Discharge Summary (Signed)
ED Clinical Summary                     [facility]  [address] [place] [address]  [phone]          PERSON INFORMATION  Name: Keith Welch, Keith Welch Age:  34 Years DOB: 1986-11-09   Sex: Male Language: English PCP: PCP,  NONE   Marital Status: Single Phone: [phone] Med Service: MED-Medicine   MRN: [id number] Acct# [id number] Arrival: 03/15/2020 12:23:00   Visit Reason: Knee pain-swelling, R; RIGHT KNEE PAIN Acuity: 4 LOS: 000 01:33   Address:    [address] [place] [facility] [address]   Diagnosis:    Knee effusion, right  Medications:          New Medications  Printed Prescriptions  HYDROcodone-acetaminophen (Norco 325 mg-5 mg oral tablet) 1 Tabs Oral (given by mouth) every 6 hours as needed moderate pain (4-7) for 3 Days. Refills: 0.  Last Dose:____________________  ibuprofen (ibuprofen 800 mg oral tablet) 1 Tabs Oral (given by mouth) 3 times a day as needed moderate pain (4-7) for 5 Days. Refills: 0.  Last Dose:____________________      Medications Administered During Visit:              Allergies      amoxicillin (Rash)      Major Tests and Procedures:  The following procedures and tests were performed during your ED visit.  COMMON PROCEDURES%>  COMMON PROCEDURES COMMENTS%>                PROVIDER INFORMATION               Provider Role Assigned Juventino Slovak N-PA ED MidLevel 03/15/2020 12:33:23    Carlis Abbott RN, Jacqulyn Ducking ED Nurse 03/15/2020 12:45:41        Attending Physician:  MAFFEO,  HEATHER N-PA      Admit Doc  MAFFEO,  HEATHER N-PA     Consulting Doc       VITALS INFORMATION  Vital Sign Triage Latest   Temp Oral ORAL_1%> ORAL%>   Temp Temporal TEMPORAL_1%> TEMPORAL%>   Temp Intravascular INTRAVASCULAR_1%> INTRAVASCULAR%>   Temp Axillary AXILLARY_1%> AXILLARY%>   Temp Rectal RECTAL_1%> RECTAL%>   02 Sat 94 % 98 %   Respiratory Rate RATE_1%> RATE%>   Peripheral Pulse Rate PULSE RATE_1%> PULSE RATE%>   Apical Heart Rate HEART RATE_1%> HEART RATE%>   Blood  Pressure BLOOD PRESSURE_1%>/ BLOOD PRESSURE_1%>88 mmHg BLOOD PRESSURE%> / BLOOD PRESSURE%>83 mmHg                 Immunizations      No Immunizations Documented This Visit          DISCHARGE INFORMATION   Discharge Disposition: H Outpt-Sent Home   Discharge Location:  Home   Discharge Date and Time:  03/15/2020 13:56:06   ED Checkout Date and Time:  03/15/2020 13:56:06     DEPART REASON INCOMPLETE INFORMATION               Depart Action Incomplete Reason   Interactive View/I&O Recently assessed               Problems      No Problems Documented              Smoking Status      No Smoking Status Documented         PATIENT EDUCATION INFORMATION  Instructions:  Excuse from Work, [employer], or Physical Activity w restrictions (CUSTOM); Cryotherapy, Easy-to-Read; Knee Sprain, Easy-to-Read; Knee Pain, Easy-to-Read; Knee Effusion, Easy-to-Read     Follow up:                   With: Address: When:   Overton Mam, Physician - Orthopedics [address] [place], [place] [address]  [phone] Business (1) Within 1 week   Comments:   Take medication as prescribed  Follow up with your primary care doctor  Return to the emergency department for any new, worsening or concerning symptoms                ED PROVIDER DOCUMENTATION

## 2020-03-15 NOTE — ED Notes (Signed)
ED Notes               pt states was seen by ortho today and had fluid removed from effusion; pt has filled and is taking meds as ordered; pt is feeling much better; no pain currently; pt will follow up with ortho as needed; pt states was provided with excellent care during his er visit  [employer]

## 2020-03-15 NOTE — ED Notes (Signed)
ED Triage Note       ED Triage Adult Entered On:  03/15/2020 12:30 EDT    Performed On:  03/15/2020 12:26 EDT by Chestine Spore, RN, Georgiann Mohs               Triage   Chief Complaint :   R knee pain, fell on Saturday, hx of surg on R knee, pain and swelling present. Dr. Malachi Pro ortho, pt fell on R knee   Numeric Rating Pain Scale :   8   [place] Mode of Arrival :   Private vehicle   Infectious Disease Documentation :   Document assessment   Temperature Oral :   36.9 degC(Converted to: 98.4 degF)    Heart Rate Monitored :   103 bpm (HI)    Respiratory Rate :   20 br/min   Systolic Blood Pressure :   140 mmHg   Diastolic Blood Pressure :   88 mmHg   SpO2 :   94 %   Oxygen Therapy :   Room air   Patient presentation :   None of the above   Chief Complaint or Presentation suggest infection :   No   Weight Dosing :   77.3 kg(Converted to: 170 lb 7 oz)    Height :   182.9 cm(Converted to: 6 ft 0 in)    Body Mass Index Dosing :   23 kg/m2   Idamae Schuller - 03/15/2020 12:26 EDT   DCP GENERIC CODE   Tracking Acuity :   4   Tracking Group :   ED [address] Tracking Group   Chestine Spore, RN, Georgiann Mohs - 03/15/2020 12:26 EDT   ED General Section :   Document assessment   Pregnancy Status :   N/A   ED Allergies Section :   Document assessment   ED Reason for Visit Section :   Document assessment   ED Home Meds Section :   Document assessment   ED Quick Assessment :   Patient appears awake, alert, oriented to baseline. Skin warm and dry. Moves all extremities. Respiration even and unlabored. Appears in no apparent distress.   Chestine Spore, RN, Georgiann Mohs - 03/15/2020 12:26 EDT   ID Risk Screen Symptoms   Recent Travel History :   No recent travel   Close Contact with COVID-19 ID :   No   Last 14 days COVID-19 ID :   No   TB Symptom Screen :   No symptoms   C. diff Symptom/History ID :   Neither of the above   Idamae Schuller - 03/15/2020 12:26 EDT   Allergies   (As Of: 03/15/2020 12:30:15 EDT)   Allergies (Active)   amoxicillin  Estimated Onset Date:   Unspecified ;  Reactions:   Rash ; Created By:   Chestine Spore, RN, Georgiann Mohs; Reaction Status:   Active ; Category:   Drug ; Substance:   amoxicillin ; Type:   Allergy ; Severity:   Mild ; Updated By:   Chestine Spore, RN, Georgiann Mohs; Reviewed Date:   03/15/2020 12:27 EDT        Psycho-Social   Last 3 mo, thoughts killing self/others :   Patient denies   Right click within box for Suspected Abuse policy link. :   None   Feels Unsafe at Home :   Yes   Chestine Spore, RN, Georgiann Mohs - 03/15/2020 12:26 EDT   ED Home Med List   Medication List   (  As Of: 03/15/2020 12:30:15 EDT)   No Known Home Medications     Chestine Spore RN, Georgiann Mohs 03/15/2020 12:29:35           ED Reason for Visit   (As Of: 03/15/2020 12:30:15 EDT)   Diagnoses(Active)    Knee pain-swelling, R  Date:   03/15/2020 ; Diagnosis Type:   Reason For Visit ; Confirmation:   Complaint of ; Clinical Dx:   Knee pain-swelling, R ; Classification:   Medical ; Clinical Service:   Non-Specified ; Code:   PNED ; Probability:   0 ; Diagnosis Code:   [id number]

## 2024-04-15 ENCOUNTER — Ambulatory Visit
Admit: 2024-04-15 | Discharge: 2024-04-15 | Payer: BLUE CROSS/BLUE SHIELD | Attending: Surgery | Primary: Family Medicine

## 2024-04-15 VITALS — Ht 72.0 in | Wt 185.0 lb

## 2024-04-15 DIAGNOSIS — K4090 Unilateral inguinal hernia, without obstruction or gangrene, not specified as recurrent: Secondary | ICD-10-CM

## 2024-04-15 NOTE — H&P (View-Only) (Signed)
 Date:  Apr 15, 2024  Patient name: Keith Welch  Date of Birth: 04-21-1986    Reason for Consult: Surgical Consult (RIGHT INGUINAL BULGE)    Primary Care Physician:Teachman, Porfirio Bristol, DO  Referring Physician: Gearld Keep, DO     HISTORY OF PRESENT ILLNESS: Keith Welch is a 38 y.o. male who presents for evaluation of a chronically incarcerated right inguinal hernia.  He says it has been present for 2 months, but that may be the time in which it has started to give him symptoms.  He recently had a laparoscopic appendectomy in October 2024.  He did not have inguinal discomfort at that time.  He denies obstructive symptomatology.  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  He is unable to perform his job as well as he would like due to the hernia discomfort.  He desires repair.    Past Medical History:   has no past medical history on file.    Past Surgical History:   has no past surgical history on file.     Allergies:  Amoxicillin    Social History:        Family History: family history is not on file.    Home Medications:    Prior to Admission medications    Medication Sig Start Date End Date Taking? Authorizing Provider   ibuprofen (ADVIL;MOTRIN) 800 MG tablet TAKE 1 TABLET BY MOUTH 3 TIMES DAILY for 30   Yes [provider]       REVIEW OF SYSTEMS:    All 14-point review of systems are completed and  pertinent positives are mentioned in the history of present illness.  Other  systems are reviewed and are negative.    Physical Examination:    Ht 1.829 m (6')   Wt 83.9 kg (185 lb)   BMI 25.09 kg/m      GENERAL APPEARANCE: in no acute distress, well nourished.  HEAD: normocephalic, atraumatic  EYES: pupils equal, sclera non-ecteric  EARS: normal  NECK: supple, full range of motion, thyroid normal, no cervical lymphadenopathy  LYMPH NODES: no axillary, inguinal, or other lymphadenopathy  HEART: regular rate and rythym  LUNGS: clear to ascultation  bilaterally  ABDOMEN: soft, nontender, nondistended, bowel sounds present.  There is no ventral hernia.  There is a fairly large scrotal indirect inguinal hernia on the right.  There is no obvious hernia on the left  SKIN: warm and dry  EXTREMITIES: no clubbing, cyanosis, or edema  NEUROLOGIC: alert and oriented, normal exam    IMPRESSION:    1. Right inguinal hernia        RECOMMENDATIONS:  I have recommended robotic inguinal herniorrhaphy and discussed this with the patient in detail, including but not limited to the risk of opening, infection, mesh infection, incisional herniation, testicular damage, nerve damage, seroma formation, hernia recurrence, and the need for further surgery.  I have discussed the possibility of repairing a contralateral hernia if one is diagnosed intraoperatively.  I have discussed the benefits of symptomatic relief and avoiding future complications that may involve the bowel.  I have discussed the need for 4 weeks of postoperative lifting restrictions.  This will be scheduled in the near future.     Return for 3weeks Post op.       All questions and concerns were addressed to the patient/family. Alternatives to my treatment were discussed.    Lannis Plain, MD    NOTE: This report  was transcribed using voice recognition software. Every effort was made to ensure accuracy, however, inadvertent computerized transcription errors may be present.

## 2024-04-15 NOTE — [facility] Notes (Signed)
 Date:  Apr 15, 2024  Patient name: Keith Welch  Date of Birth: 04-21-1986    Reason for Consult: Surgical Consult (RIGHT INGUINAL BULGE)    Primary Care Physician:Teachman, Porfirio Bristol, DO  Referring Physician: Gearld Keep, DO     HISTORY OF PRESENT ILLNESS: Keith Welch is a 38 y.o. male who presents for evaluation of a chronically incarcerated right inguinal hernia.  He says it has been present for 2 months, but that may be the time in which it has started to give him symptoms.  He recently had a laparoscopic appendectomy in October 2024.  He did not have inguinal discomfort at that time.  He denies obstructive symptomatology.  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  He is unable to perform his job as well as he would like due to the hernia discomfort.  He desires repair.    Past Medical History:   has no past medical history on file.    Past Surgical History:   has no past surgical history on file.     Allergies:  Amoxicillin    Social History:        Family History: family history is not on file.    Home Medications:    Prior to Admission medications    Medication Sig Start Date End Date Taking? Authorizing Provider   ibuprofen (ADVIL;MOTRIN) 800 MG tablet TAKE 1 TABLET BY MOUTH 3 TIMES DAILY for 30   Yes [provider]       REVIEW OF SYSTEMS:    All 14-point review of systems are completed and  pertinent positives are mentioned in the history of present illness.  Other  systems are reviewed and are negative.    Physical Examination:    Ht 1.829 m (6')   Wt 83.9 kg (185 lb)   BMI 25.09 kg/m      GENERAL APPEARANCE: in no acute distress, well nourished.  HEAD: normocephalic, atraumatic  EYES: pupils equal, sclera non-ecteric  EARS: normal  NECK: supple, full range of motion, thyroid normal, no cervical lymphadenopathy  LYMPH NODES: no axillary, inguinal, or other lymphadenopathy  HEART: regular rate and rythym  LUNGS: clear to ascultation  bilaterally  ABDOMEN: soft, nontender, nondistended, bowel sounds present.  There is no ventral hernia.  There is a fairly large scrotal indirect inguinal hernia on the right.  There is no obvious hernia on the left  SKIN: warm and dry  EXTREMITIES: no clubbing, cyanosis, or edema  NEUROLOGIC: alert and oriented, normal exam    IMPRESSION:    1. Right inguinal hernia        RECOMMENDATIONS:  I have recommended robotic inguinal herniorrhaphy and discussed this with the patient in detail, including but not limited to the risk of opening, infection, mesh infection, incisional herniation, testicular damage, nerve damage, seroma formation, hernia recurrence, and the need for further surgery.  I have discussed the possibility of repairing a contralateral hernia if one is diagnosed intraoperatively.  I have discussed the benefits of symptomatic relief and avoiding future complications that may involve the bowel.  I have discussed the need for 4 weeks of postoperative lifting restrictions.  This will be scheduled in the near future.     Return for 3weeks Post op.       All questions and concerns were addressed to the patient/family. Alternatives to my treatment were discussed.    Lannis Plain, MD    NOTE: This report  was transcribed using voice recognition software. Every effort was made to ensure accuracy, however, inadvertent computerized transcription errors may be present.

## 2024-04-16 ENCOUNTER — Encounter

## 2024-04-17 NOTE — [place] Notes (Signed)
 Pre Procedure Patient Instructions     Procedure Location hospital:[facility]: [address], [place] in the [facility] [address] [place]. Bring your parking ticket with you inside to be validated. Take the covered pathway to the Admitting Entrance.  Follow the signs to the Public Elevators/Lobby. Take the lobby elevators to the 7th Floor. Enter the Procedural Waiting Room, directly in front of you and check in.   Procedure Date 04/23/24  Arrival Time  7:30    Your doctor determines your scheduled start time.  Depending on the facility where your procedure is taking place and the scheduled start time, you may be required to arrive up to 2 hours prior.  This is to allow time for registration, your preop assessment, any day of procedure testing and to meet with your care teams members.  This also allows your procedure to be performed earlier should there be a cancellation that day.  We appreciate your patience as we work to provide you with excellent service.    Medications: Follow the medication instructions below to prevent your procedure from being cancelled.    Medication to be taken the morning of surgery with a few sips of water only: NONE    Continue taking all medications reviewed with the preadmission nurse EXCEPT follow the instructions below.  Hold or reduce the dosage of the following medication, as discussed:     Hold IBUPROFEN for 7 day(s) prior to your procedure.  LAST DOSE: NOW      Do not take over the counter pain medications except plain Tylenol or Acetaminophen for seven days prior to your procedure unless your doctor told you otherwise.    If you are taking any diabetes and or weight loss medications (including injectables and shots) not discussed during the call with the PreAdmission Nurse, it is important to call [phone] to discuss important instructions.    If you are taking blood thinners, and have not been told when to hold the dose, call the doctor performing your  procedure for instructions on when to stop.    On the day of your procedure, a nurse will review your medications and ask when you last took each one.        Procedure Preparation    Diet Restrictions:Studies show carbohydrate drinks before procedures are beneficial.  For this reason, Anesthesia asks that you drink 8 oz of non-red Gatorade (sugar free if diabetic) at 6pm and again before midnight the day before your procedure.  If your surgeon instructs you to drink more than 8oz of Gatorade, it's fine to do so.  No food or drink including gum or mints -after midnight the day of your procedure EXCEPT drink 8 oz of non-red Gatorade (sugar free if diabetic) to be completed 2 hours prior to your instructed ARRIVAL time.  Gatorade 8 oz is the only approved drink and amount. If your surgeon instructs you to drink more than 8oz of Gatorade, it's fine to do so.      If you have questions on the day of your procedure call [facility] 7th floor Preop at [phone].    Skin Preparation:  Wash with Hibiclens or an antibacterial soap (e.g. Dial soap) the night before and morning of procedure.    Do not put on any deodorants, lotions, powders, or oils on the day of procedure.  Be sure to put on clean, comfortable loose fitting clothing.    Other Preparation:  Call your doctor right away if you  get any wounds, cuts, scrapes, scabs, rashes, bug bites at or near your procedure site or if you have any fever, cold or flu symptoms    No test required before your procedure.          Day of Procedure Patient Instruction:  Do not smoke, vape, chew tobacco, drink alcohol or use recreational drugs on the day of your procedure  Remove all jewelry, piercings, and metal accessories  Do not wear artificial nails and only clear nail polish on natural nails. Nails must be trimmed to fingertip length to make sure the oxygen probe fits properly and to avoid injury  Do not wear artificial eye lashes because they can cause dry eyes, eye  scratches, eye infections and allergic reactions  Do not use hair extensions with metal clips or hairstyles near the back of your neck, as it can make it difficult to safely manage your breathing during anesthesia  If your hair is tightly braided or styled in a complex way, it may need to be undone for your safety  Do not wear contacts, tampons, make-up, lotions, creams, powders, fragrances or deodorant  Wear loose fit clothing  Do not bring valuables or money  Bring a copy of your Living Will and/or Medical Durable Power of Attorney if you have one  Bring a list of current medications including name and dosage  Bring a picture ID and insurance card     Refer to the doctor's office for day of procedure recommended clothing          If you are going home the same day as your procedure, a support person should accompany you to the facility and must transport you home.  If you plan to take public transportation of any sort, your support person must accompany you home.  You should have someone to stay with you for 24 hours after your procedure with sedation of any kind.       Comments:     This information was reviewed with you during your Pre-Admission Testing interview and you verbalized understanding. If you have any additional questions please contact [phone].    If you have any scheduling concerns or procedure questions (including questions after your procedure) please contact your physician's office. Lannis Plain, MD  Phone Number: [phone]     To pre-register for your procedure please call [phone] Option 2 and then Option 3.    For financial questions regarding your procedure at a [facility] facility, please contact [phone].    For financial questions regarding anesthesia at a [facility] facility, please  contact [phone].    For MyChart Patient Portal help please call [phone].    For hotel accommodations please visit  [email or web address] and select PATIENTS &VISITORS -> "FOR VISITORS" section and then TRAVEL INFORMATION -> PLACES TO STAY

## 2024-04-22 NOTE — Anesthesia Pre-Procedure Evaluation (Signed)
 Department of Anesthesiology  Preprocedure Note       Name:  Keith Welch   Age:  38 y.o.  DOB:  12/23/1985                                          MRN:  [id number]         Date:  04/22/2024      Surgeon: Kelby Patches):  Blessing, Lincoln Renshaw, MD    Procedure: Procedure(s):  ROBOTIC RIGHT INGUINAL HERNIA REPAIR POSSIBLE CONTRALATERAL    Medications prior to admission:   Prior to Admission medications    Medication Sig Start Date End Date Taking? Authorizing Provider   ibuprofen (ADVIL;MOTRIN) 800 MG tablet Take 1 tablet by mouth every 6 hours as needed for Pain    [provider]       Current medications:    No current facility-administered medications for this encounter.     Current Outpatient Medications   Medication Sig Dispense Refill   . ibuprofen (ADVIL;MOTRIN) 800 MG tablet Take 1 tablet by mouth every 6 hours as needed for Pain         Allergies:    Allergies   Allergen Reactions   . Amoxicillin Rash       Problem List:    Patient Active Problem List   Diagnosis Code   . Inguinal hernia of right side without obstruction or gangrene K40.90       Past Medical History:        Diagnosis Date   . Inguinal hernia     right       Past Surgical History:        Procedure Laterality Date   . APPENDECTOMY     . FEMORAL ARTERY REPAIR Right    . ORIF TIBIA FRACTURE Bilateral        Social History:    Social History     Tobacco Use   . Smoking status: Former     Types: Cigarettes   . Smokeless tobacco: Never   Substance Use Topics   . Alcohol use: Yes     Comment: 3 daily                                Counseling given: Not Answered      Vital Signs (Current):   Vitals:    04/17/24 0839   Weight: 83.9 kg (185 lb)   Height: 1.829 m (6')                                              BP Readings from Last 3 Encounters:   No data found for BP       NPO Status:                                                                                 BMI:   Wt Readings  from Last 3 Encounters:   04/15/24 83.9 kg (185 lb)     Body mass  index is 25.09 kg/m.    CBC: No results found for: "WBC", "RBC", "HGB", "HCT", "MCV", "RDW", "PLT"    CMP: No results found for: "NA", "K", "CL", "CO2", "BUN", "CREATININE", "GFRAA", "AGRATIO", "LABGLOM", "GLUCOSE", "GLU", "CALCIUM", "BILITOT", "ALKPHOS", "AST", "ALT"    POC Tests: No results for input(s): "POCGLU", "POCNA", "POCK", "POCCL", "POCBUN", "POCHEMO", "POCHCT" in the last 72 hours.    Coags: No results found for: "PROTIME", "INR", "APTT"    HCG (If Applicable): No results found for: "PREGTESTUR", "PREGSERUM", "HCG", "HCGQUANT"     ABGs: No results found for: "PHART", "PO2ART", "PCO2ART", "HCO3ART", "BEART", "O2SATART"     Type & Screen (If Applicable):  No results found for: "ABORH", "LABANTI"    Drug/Infectious Status (If Applicable):  No results found for: "HIV", "HEPCAB"    COVID-19 Screening (If Applicable): No results found for: "COVID19"        Anesthesia Evaluation  Patient summary reviewed and Nursing notes reviewed   no history of anesthetic complications:   Airway: Mallampati: II  TM distance: >3 FB   Neck ROM: full  Mouth opening: > = 3 FB   Dental: normal exam         Pulmonary:normal exam        (-) asthma, shortness of breath and not a current smoker                           Cardiovascular:Negative CV ROS  Exercise tolerance: good (>4 METS)      (-)  DOE and murmur                Neuro/Psych:                ROS comment: EtOH 3+ daily GI/Hepatic/Renal: Neg GI/Hepatic/Renal ROS           ROS comment: Inguinal hernia - for repair.   Endo/Other: Negative Endo/Other ROS                    Abdominal: normal exam            Vascular: negative vascular ROS.         Other Findings:         Anesthesia Plan      general     ASA 1       Induction: intravenous.    MIPS: Postoperative opioids intended and Prophylactic antiemetics administered.  Anesthetic plan and risks discussed with patient.      Plan discussed with CRNA.    Attending anesthesiologist reviewed and agrees with Preprocedure  content              Waldon Gula, MD   04/22/2024

## 2024-04-23 ENCOUNTER — Inpatient Hospital Stay: Payer: BLUE CROSS/BLUE SHIELD | Attending: Surgery

## 2024-04-23 MED ORDER — OXYCODONE HCL 5 MG PO TABS
5 | ORAL | Status: AC
Start: 2024-04-23 — End: 2024-04-23
  Administered 2024-04-23: 16:00:00 5 via ORAL

## 2024-04-23 MED ORDER — KETOROLAC TROMETHAMINE 15 MG/ML IJ SOLN
15 | Freq: Once | INTRAMUSCULAR | Status: AC
Start: 2024-04-23 — End: 2024-04-23
  Administered 2024-04-23: 12:00:00 15 mg via INTRAVENOUS

## 2024-04-23 MED ORDER — NORMAL SALINE FLUSH 0.9 % IV SOLN
0.9 | INTRAVENOUS | Status: DC | PRN
Start: 2024-04-23 — End: 2024-04-23
  Administered 2024-04-23: 12:00:00 10 mL via INTRAVENOUS

## 2024-04-23 MED ORDER — DEXAMETHASONE SODIUM PHOSPHATE 4 MG/ML IJ SOLN
4 | Freq: Once | INTRAMUSCULAR | Status: DC | PRN
Start: 2024-04-23 — End: 2024-04-23
  Administered 2024-04-23: 14:00:00 4 via INTRAVENOUS

## 2024-04-23 MED ORDER — OXYCODONE HCL 5 MG PO TABS
5 | Freq: Once | ORAL | Status: AC
Start: 2024-04-23 — End: 2024-04-23

## 2024-04-23 MED ORDER — FENTANYL CITRATE (PF) 100 MCG/2ML IJ SOLN
100 | INTRAMUSCULAR | Status: AC
Start: 2024-04-23 — End: ?

## 2024-04-23 MED ORDER — HYDROMORPHONE HCL 1 MG/ML IJ SOLN
1 | INTRAMUSCULAR | Status: AC
Start: 2024-04-23 — End: ?

## 2024-04-23 MED ORDER — PROPOFOL 200 MG/20ML IV EMUL
200 | INTRAVENOUS | Status: AC
Start: 2024-04-23 — End: ?

## 2024-04-23 MED ORDER — HALOPERIDOL LACTATE 5 MG/ML IJ SOLN
5 | Freq: Once | INTRAMUSCULAR | Status: DC | PRN
Start: 2024-04-23 — End: 2024-04-23

## 2024-04-23 MED ORDER — HYDROMORPHONE HCL 1 MG/ML IJ SOLN
1 | Freq: Once | INTRAMUSCULAR | Status: DC | PRN
Start: 2024-04-23 — End: 2024-04-23
  Administered 2024-04-23 (×2): .25 via INTRAVENOUS

## 2024-04-23 MED ORDER — ONDANSETRON HCL 4 MG/2ML IJ SOLN
4 | INTRAMUSCULAR | Status: AC
Start: 2024-04-23 — End: ?

## 2024-04-23 MED ORDER — NORMAL SALINE FLUSH 0.9 % IV SOLN
0.9 | INTRAVENOUS | Status: DC | PRN
Start: 2024-04-23 — End: 2024-04-23

## 2024-04-23 MED ORDER — ROCURONIUM BROMIDE 50 MG/5ML IV SOLN
50 | INTRAVENOUS | Status: AC
Start: 2024-04-23 — End: ?

## 2024-04-23 MED ORDER — SODIUM CHLORIDE (PF) 0.9 % IJ SOLN
0.9 | INTRAMUSCULAR | Status: DC | PRN
Start: 2024-04-23 — End: 2024-04-23

## 2024-04-23 MED ORDER — IBUPROFEN 800 MG PO TABS
800 | ORAL_TABLET | Freq: Three times a day (TID) | ORAL | 1 refills | 14.00000 days | Status: DC
Start: 2024-04-23 — End: 2024-05-13

## 2024-04-23 MED ORDER — NORMAL SALINE FLUSH 0.9 % IV SOLN
0.9 | Freq: Two times a day (BID) | INTRAVENOUS | Status: DC
Start: 2024-04-23 — End: 2024-04-23

## 2024-04-23 MED ORDER — DEXTROSE 10 % IV BOLUS
INTRAVENOUS | Status: DC | PRN
Start: 2024-04-23 — End: 2024-04-23

## 2024-04-23 MED ORDER — GLUCAGON EMERGENCY 1 MG/ML IJ SOLR
1 | INTRAMUSCULAR | Status: DC | PRN
Start: 2024-04-23 — End: 2024-04-23

## 2024-04-23 MED ORDER — GLUCOSE 4 G PO CHEW
4 | ORAL | Status: DC | PRN
Start: 2024-04-23 — End: 2024-04-23

## 2024-04-23 MED ORDER — DEXTROSE 10 % IV SOLN
10 | INTRAVENOUS | Status: DC | PRN
Start: 2024-04-23 — End: 2024-04-23

## 2024-04-23 MED ORDER — LIDOCAINE HCL (PF) 2 % IJ SOLN
2 | INTRAMUSCULAR | Status: AC
Start: 2024-04-23 — End: ?

## 2024-04-23 MED ORDER — BUPIVACAINE-EPINEPHRINE 0.5% -1:200000 IJ SOLN
INTRAMUSCULAR | Status: DC | PRN
Start: 2024-04-23 — End: 2024-04-23
  Administered 2024-04-23: 14:00:00 30 via INTRAPLEURAL

## 2024-04-23 MED ORDER — ROCURONIUM BROMIDE 50 MG/5ML IV SOLN
50 | Freq: Once | INTRAVENOUS | Status: DC | PRN
Start: 2024-04-23 — End: 2024-04-23
  Administered 2024-04-23: 14:00:00 50 via INTRAVENOUS

## 2024-04-23 MED ORDER — PROPOFOL 200 MG/20ML IV EMUL
200 | Freq: Once | INTRAVENOUS | Status: DC | PRN
Start: 2024-04-23 — End: 2024-04-23
  Administered 2024-04-23: 14:00:00 200 via INTRAVENOUS

## 2024-04-23 MED ORDER — OXYCODONE HCL 5 MG PO TABS
5 | ORAL_TABLET | ORAL | 0 refills | 5.00000 days | Status: AC | PRN
Start: 2024-04-23 — End: 2024-04-26

## 2024-04-23 MED ORDER — NEOSTIGMINE-GLYCOPYRROLATE 3-0.6 MG/3ML IV SOSY
3-0.6 | Freq: Once | INTRAVENOUS | Status: DC | PRN
Start: 2024-04-23 — End: 2024-04-23
  Administered 2024-04-23 (×2): 3 via INTRAVENOUS

## 2024-04-23 MED ORDER — ACETAMINOPHEN 500 MG PO TABS
500 | ORAL_TABLET | Freq: Three times a day (TID) | ORAL | 0 refills | 14.00000 days | Status: DC | PRN
Start: 2024-04-23 — End: 2024-05-13

## 2024-04-23 MED ORDER — LACTATED RINGERS IV SOLN
INTRAVENOUS | Status: DC
Start: 2024-04-23 — End: 2024-04-23
  Administered 2024-04-23 (×2): via INTRAVENOUS

## 2024-04-23 MED ORDER — CEFAZOLIN SODIUM 2 G SOLR (MIXTURES ONLY)
2 | INTRAVENOUS | Status: AC
Start: 2024-04-23 — End: 2024-04-23
  Administered 2024-04-23: 14:00:00 2000 mg via INTRAVENOUS

## 2024-04-23 MED ORDER — DEXAMETHASONE SODIUM PHOSPHATE 4 MG/ML IJ SOLN
4 | INTRAMUSCULAR | Status: AC
Start: 2024-04-23 — End: ?

## 2024-04-23 MED ORDER — FENTANYL CITRATE (PF) 100 MCG/2ML IJ SOLN
100 | Freq: Once | INTRAMUSCULAR | Status: DC | PRN
Start: 2024-04-23 — End: 2024-04-23
  Administered 2024-04-23 (×2): 50 via INTRAVENOUS

## 2024-04-23 MED ORDER — DOCUSATE SODIUM 100 MG PO CAPS
100 | ORAL_CAPSULE | Freq: Two times a day (BID) | ORAL | 0 refills | 30.00000 days | Status: AC | PRN
Start: 2024-04-23 — End: 2024-05-07

## 2024-04-23 MED ORDER — MIDAZOLAM HCL 2 MG/2ML IJ SOLN
2 | INTRAMUSCULAR | Status: AC
Start: 2024-04-23 — End: ?

## 2024-04-23 MED ORDER — SODIUM CHLORIDE 0.9 % IV SOLN
0.9 | INTRAVENOUS | Status: DC | PRN
Start: 2024-04-23 — End: 2024-04-23

## 2024-04-23 MED ORDER — HYDROMORPHONE HCL 1 MG/ML IJ SOLN
1 | INTRAMUSCULAR | Status: DC | PRN
Start: 2024-04-23 — End: 2024-04-23

## 2024-04-23 MED ORDER — PHENYLEPHRINE HCL 1 MG/10ML IV SOSY
1 | INTRAVENOUS | Status: AC
Start: 2024-04-23 — End: ?

## 2024-04-23 MED ORDER — MIDAZOLAM HCL 2 MG/2ML IJ SOLN
2 | Freq: Once | INTRAMUSCULAR | Status: DC | PRN
Start: 2024-04-23 — End: 2024-04-23
  Administered 2024-04-23: 14:00:00 2 via INTRAVENOUS

## 2024-04-23 MED ORDER — ONDANSETRON HCL 4 MG/2ML IJ SOLN
4 | Freq: Once | INTRAMUSCULAR | Status: DC | PRN
Start: 2024-04-23 — End: 2024-04-23
  Administered 2024-04-23: 15:00:00 4 via INTRAVENOUS

## 2024-04-23 MED ORDER — ONDANSETRON HCL 4 MG/2ML IJ SOLN
4 | Freq: Once | INTRAMUSCULAR | Status: DC | PRN
Start: 2024-04-23 — End: 2024-04-23

## 2024-04-23 MED ORDER — LIDOCAINE HCL (PF) 2 % IJ SOLN
2 | Freq: Once | INTRAMUSCULAR | Status: DC | PRN
Start: 2024-04-23 — End: 2024-04-23
  Administered 2024-04-23: 14:00:00 40 via INTRAVENOUS

## 2024-04-23 MED ORDER — SUGAMMADEX SODIUM 200 MG/2ML IV SOLN
200 | INTRAVENOUS | Status: AC
Start: 2024-04-23 — End: ?

## 2024-04-23 MED ORDER — DEXMEDETOMIDINE HCL 200 MCG/2ML IV SOLN
200 | Freq: Once | INTRAVENOUS | Status: DC | PRN
Start: 2024-04-23 — End: 2024-04-23
  Administered 2024-04-23: 14:00:00 12 via INTRAVENOUS
  Administered 2024-04-23: 15:00:00 8 via INTRAVENOUS

## 2024-04-23 MED ORDER — ACETAMINOPHEN 500 MG PO TABS
500 | Freq: Once | ORAL | Status: AC
Start: 2024-04-23 — End: 2024-04-23
  Administered 2024-04-23: 12:00:00 1000 mg via ORAL

## 2024-04-23 MED FILL — ROCURONIUM BROMIDE 50 MG/5ML IV SOLN: 50 MG/5ML | INTRAVENOUS | Qty: 5 | Fill #0

## 2024-04-23 MED FILL — TYLENOL EXTRA STRENGTH 500 MG PO TABS: 500 MG | ORAL | Qty: 2 | Fill #0

## 2024-04-23 MED FILL — OXYCODONE HCL 5 MG PO TABS: 5 MG | ORAL | Qty: 1 | Fill #0

## 2024-04-23 MED FILL — ONDANSETRON HCL 4 MG/2ML IJ SOLN: 4 MG/2ML | INTRAMUSCULAR | Qty: 2 | Fill #0

## 2024-04-23 MED FILL — PHENYLEPHRINE HCL (PRESSORS) 1 MG/10ML IV SOSY: 1 MG/0ML | INTRAVENOUS | Qty: 10 | Fill #0

## 2024-04-23 MED FILL — PROPOFOL 200 MG/20ML IV EMUL: 200 MG/20ML | INTRAVENOUS | Qty: 20 | Fill #0

## 2024-04-23 MED FILL — KETOROLAC TROMETHAMINE 15 MG/ML IJ SOLN: 15 MG/ML | INTRAMUSCULAR | Qty: 1 | Fill #0

## 2024-04-23 MED FILL — DEXAMETHASONE SODIUM PHOSPHATE 4 MG/ML IJ SOLN: 4 MG/ML | INTRAMUSCULAR | Qty: 1 | Fill #0

## 2024-04-23 MED FILL — BRIDION 200 MG/2ML IV SOLN: 200 MG/2ML | INTRAVENOUS | Qty: 4 | Fill #0

## 2024-04-23 MED FILL — CEFAZOLIN SODIUM 2 G IJ SOLR: 2 g | INTRAMUSCULAR | Qty: 2000 | Fill #0

## 2024-04-23 MED FILL — XYLOCAINE-MPF 2 % IJ SOLN: 2 % | INTRAMUSCULAR | Qty: 5 | Fill #0

## 2024-04-23 MED FILL — FENTANYL CITRATE (PF) 100 MCG/2ML IJ SOLN: 100 MCG/2ML | INTRAMUSCULAR | Qty: 2 | Fill #0

## 2024-04-23 MED FILL — MIDAZOLAM HCL 2 MG/2ML IJ SOLN: 2 MG/ML | INTRAMUSCULAR | Qty: 2 | Fill #0

## 2024-04-23 MED FILL — DILAUDID 1 MG/ML IJ SOLN: 1 MG/ML | INTRAMUSCULAR | Qty: 0.5 | Fill #0

## 2024-04-23 NOTE — Op Note (Signed)
 Operative Report  Keith Welch  [id number]     DATE OF PROCEDURE: 04/23/2024    HISTORY: Keith Welch is a  38 y.o.  male with a symptomatic right inguinal hernia. Robotic repair is recommended.  Please see detailed discussion of the surgical procedure documented in my office note.  Informed consent is obtained prior to the procedure.    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  This case took nearly twice as long as normal.  A 22 modifier will be added  Ancef weight based dose given within 1 hour of surgery  Mesh type: Bard 3D max large size  Operative time: Twice as long as normal  Anterior abdominal wall stitch placed medial and lateral to epigastric vessels  SPECIMEN(S): None  COMPLICATIONS: None  TECHNIQUE:   The patient is taken to the operating room and placed in the supine position.  General endotracheal anesthesia is administered without complication.  Both arms are tucked to the patient's side.  The abdomen is prepped and draped in the normal sterile fashion.  A timeout is conducted and prophylactic antibiotic administration confirmed.  A supraumbilical incision is made after local anesthesia is injected.  The Optiview technique is used to place a 5 mm trocar into the abdominal cavity under direct laparoscopic visualization without injury to intraperitoneal organs.  The abdomen is insufflated to 15 mmHg, and the patient is placed in 15 degrees of Trendelenburg position.  Inspection of both groins reveals only the right sided hernia.  There  is no hernia on the left.  Additional 8 mm trochars are placed in the usual fashion in the midaxillary line on both sides just above the umbilicus.  The 5 mm laparoscopic port is replaced with a 8 mm robotic port.  The mesh and 2 sutures are placed intracorporeally.  The robot is docked under my direct supervision.  The instruments are placed, after which I break scrub and go to the console.  The peritoneum was gently pulled away from the anterior abdominal wall in the upper inguinal region.  Hook electrocautery is used to dissect the peritoneum away from the anterior abdominal wall.  The dissection is carried inferiorly toward the spermatic cord vessels opening up the preperitoneal space medially behind the bladder to the pubic symphysis.  Lateral to the spermatic cord vessels the dissection is carried medially and inferiorly over the psoas muscle.  Attention is turned to carefully dissecting the hernia sac and peritoneum off of the spermatic cord structures reducing the hernia sac into the preperitoneal space.  This maneuver requires twice as much effort and meticulous dissection.  There is difficulty with hemostasis due to the chronic adherence of the longstanding hernia sac to the testicular vessels.  Ray-Tec gauze is placed to assist with hemostasis and blunt dissection.  This is removed once hemostasis is obtained prior to placement of the mesh.  This dissection is carried medially and inferiorly connecting the previously described medial and lateral dissections over the psoas muscle.  This creates a preperitoneal space large enough to accommodate the mesh.  The 3D max mid weight large size is chosen and placed in the preperitoneal pocket.  There is excellent overlap of the hernia defect. The mesh is sutured to the pubic symphysis with absorbable suture. An anterior abdominal wall suture is placed loosely lateral and medial to the inferior epigastric vessels. The abdominal pressure is reduced to 7 mmHg and a  3-0 VLock suture is used to close the peritoneum in a running fashion.  Once the peritoneum is closed, the pressure is brought back up to 15 mmHg.  There is no clam shelling of the mesh.  The needles and excess suture are removed.  The ports are removed under laparoscopic visualization.  Port sites hemostasis is excellent.  Subcutaneous tissues and skin are closed with observable suture followed by Dermabond.  The patient is extubated in the operating room and taken to recovery room having tolerated procedure well.  As described above, this case took twice as long as normal due to the chronic incarceration and chronic inflammatory process with adherence of the hernia sac all the way down almost to the testicle.  A 22 modifier will be added    Physician Signature: Electronically signed by Lannis Plain, MD, M.D.

## 2024-04-23 NOTE — Interval H&P Note (Signed)
 The patient's History and Physical of Apr 15, 2024 was reviewed with the patient and I examined the patient. There was no change.      Plan: The risks, benefits, expected outcome, and alternative to the recommended procedure have been discussed with the patient. Patient understands and wants to proceed with the procedure.

## 2024-04-23 NOTE — Anesthesia Procedure Notes (Signed)
 Airway  Date/Time: 04/23/2024 9:58 AM  Urgency: elective    Airway not difficult    General Information and Staff    Patient location during procedure: OR  Anesthesiologist: Waldon Gula, MD  Resident/CRNA: Aden Honor, APRN - CRNA  Performed: resident/CRNA/CAA   Performed by: Aden Honor, APRN - CRNA  Authorized by: Waldon Gula, MD      Indications and Patient Condition  Indications for airway management: anesthesia  Sedation level: deep  Preoxygenated: yes  Patient position: sniffing  Mask difficulty assessment: vent by bag mask    Final Airway Details  Final airway type: endotracheal airway      Successful airway: ETT  Cuffed: yes   Successful intubation technique: direct laryngoscopy  Facilitating devices/methods: intubating stylet  Endotracheal tube insertion site: oral  Blade: Macintosh  Blade size: #3  ETT size (mm): 7.5  Cormack-Lehane Classification: grade I - full view of glottis  Placement verified by: capnometry   Number of attempts at approach: 1  Ventilation between attempts: bag mask    Additional Comments  ASA monitors applied. Patient preoxygenated. IV induction. Eyes taped. Easy mask ventilation. Direct laryngoscopy, 1 attempt. Easy, atraumatic intubation with oral ETT. Positive ETCO2 detected, ETT secured. Patient positioned, head & neck supported. All pressure points padded.  no

## 2024-04-23 NOTE — Discharge Instructions (Signed)
 Diet: Continue your regular diet as tolerated.  Activity:  No heavy lifting or full speed activity for 4 weeks.  Wound Care: Shower OK in am. No submerging incisions for 4-5 days  Scrotal swelling and bruising is normal  Call Dr. Jovita Kussmaul office [phone] to schedule a 3 week follow up appointment  Please note the number of narcotic pills taken after you get home  Call if something with your recovery does not seem right   Manage constipation aggressively with MiraLAX or other effective laxative if the Colace is not working

## 2024-04-23 NOTE — Anesthesia Post-Procedure Evaluation (Signed)
 Department of Anesthesiology  Postprocedure Note    Patient: Keith Welch  MRN: [id number]  Birthdate: [birth year]  Date of evaluation: 04/23/2024    Procedure Summary       Date: 04/23/24 Room / Location: [facility] OR 03 / [facility] OR    Anesthesia Start: 0940 Anesthesia Stop: 1101    Procedure: ROBOTIC RIGHT INGUINAL HERNIA REPAIR POSSIBLE CONTRALATERAL (Right: Abdomen) Diagnosis:       Inguinal hernia of right side without obstruction or gangrene      (Inguinal hernia of right side without obstruction or gangrene [K40.90])    Surgeons: Lannis Plain, MD Responsible Provider: Waldon Gula, MD    Anesthesia Type: General ASA Status: 1            Anesthesia Type: General    Aldrete Phase I: Aldrete Score: 10    Aldrete Phase II: Aldrete Score: 10    Anesthesia Post Evaluation    Patient location during evaluation: PACU  Level of consciousness: sleepy but conscious  Airway patency: patent  Nausea & Vomiting: no nausea and no vomiting  Cardiovascular status: hemodynamically stable  Respiratory status: acceptable, nonlabored ventilation, spontaneous ventilation and face mask  Hydration status: euvolemic  Comments: Vital signs in PACU reviewed by anesthesia provider and/or documented in Handoff.  Multimodal analgesia pain management approach  Pain management: adequate    There were no known notable events for this encounter.

## 2024-05-13 ENCOUNTER — Ambulatory Visit
Admit: 2024-05-13 | Discharge: 2024-05-13 | Payer: BLUE CROSS/BLUE SHIELD | Attending: Surgery | Primary: Family Medicine

## 2024-05-13 DIAGNOSIS — K4090 Unilateral inguinal hernia, without obstruction or gangrene, not specified as recurrent: Secondary | ICD-10-CM

## 2024-05-13 NOTE — Progress Notes (Signed)
 [facility] Note          Chief Complaint   Patient presents with    Post-Op Check     S/P ROBOTIC REPAIR [facility] 04/23/24       PCP: Gearld Keep, DO    HPI: Mohamad Bruso is a 38 y.o. male who presents for a post operative visit s/p robotic right inguinal hernia that was large and chronically incarcerated.  He has done well.  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  He is not scheduled to go back to work until July.  He noticed an increased in swelling in his right hemiscrotum at day 5 postoperatively.  This was associated with some bruising and it gave him some concern.  The pain lasted another 2 days and then almost completely resolved but the swelling persisted.  He did have a good bit of bruising but this has resolved as well.     Narcotic Tablets - 3  Urinary Retention - None  Constipation - Mild  Shoulder Pain - None  Days to Pain-free - 7  Days to Normal - 10  Seroma - Yes                 PHYSICAL EXAM:    Port site incisions are healing beautifully without evidence of infection or incisional herniation.  The hernia repair is intact without evidence of recurrence.  There is an expected seroma along the inguinal canal on the right.  This is smaller than his original hernia.  There is no evidence of infection    Encounter Diagnoses   Name Primary?    Right inguinal hernia Yes    Postoperative seroma of subcutaneous tissue after non-dermatologic procedure          Assessment/Plan:   I have reassured him that the seroma will continue to resolve with time.  I have cleared him for unrestricted activity in the next 2 weeks.  I think he could probably return to work sooner than July if he desired.  I will see him back in 4 months to recheck the seroma      Return in about 4 months (around 09/12/2024).          NOTE: This report, in part or full,may have been transcribed using voice recognition software. Every effort was made to ensure accuracy; however,  inadvertent computerized transcription errors may be present. Please excuse any transcriptional grammatical or spelling errors that may have escaped my editorial review.

## 2024-09-16 ENCOUNTER — Encounter: Payer: BLUE CROSS/BLUE SHIELD | Attending: Surgery | Primary: Family Medicine
